# Patient Record
Sex: Female | Born: 1960
Health system: Southern US, Community
[De-identification: ages and names within clinical notes are randomized; demographics above are authoritative.]

## PROBLEM LIST (undated history)

## (undated) DIAGNOSIS — R519 Headache, unspecified: Secondary | ICD-10-CM

## (undated) DIAGNOSIS — R51 Headache: Secondary | ICD-10-CM

## (undated) DIAGNOSIS — F419 Anxiety disorder, unspecified: Secondary | ICD-10-CM

## (undated) HISTORY — DX: Anxiety disorder, unspecified: F41.9

## (undated) HISTORY — DX: Headache, unspecified: R51.9

## (undated) HISTORY — DX: Headache: R51

---

## 1998-01-16 ENCOUNTER — Other Ambulatory Visit: Admission: RE | Admit: 1998-01-16 | Discharge: 1998-01-16 | Payer: Self-pay | Admitting: Gynecology

## 1999-01-28 ENCOUNTER — Other Ambulatory Visit: Admission: RE | Admit: 1999-01-28 | Discharge: 1999-01-28 | Payer: Self-pay | Admitting: Gynecology

## 2000-04-07 ENCOUNTER — Other Ambulatory Visit: Admission: RE | Admit: 2000-04-07 | Discharge: 2000-04-07 | Payer: Self-pay | Admitting: Gynecology

## 2001-05-05 ENCOUNTER — Other Ambulatory Visit: Admission: RE | Admit: 2001-05-05 | Discharge: 2001-05-05 | Payer: Self-pay | Admitting: Gynecology

## 2002-05-29 ENCOUNTER — Other Ambulatory Visit: Admission: RE | Admit: 2002-05-29 | Discharge: 2002-05-29 | Payer: Self-pay | Admitting: Gynecology

## 2003-05-30 ENCOUNTER — Other Ambulatory Visit: Admission: RE | Admit: 2003-05-30 | Discharge: 2003-05-30 | Payer: Self-pay | Admitting: Gynecology

## 2004-06-02 ENCOUNTER — Other Ambulatory Visit: Admission: RE | Admit: 2004-06-02 | Discharge: 2004-06-02 | Payer: Self-pay | Admitting: Gynecology

## 2004-06-17 ENCOUNTER — Encounter: Admission: RE | Admit: 2004-06-17 | Discharge: 2004-06-17 | Payer: Self-pay | Admitting: Gynecology

## 2005-06-10 ENCOUNTER — Other Ambulatory Visit: Admission: RE | Admit: 2005-06-10 | Discharge: 2005-06-10 | Payer: Self-pay | Admitting: Gynecology

## 2006-06-22 ENCOUNTER — Other Ambulatory Visit: Admission: RE | Admit: 2006-06-22 | Discharge: 2006-06-22 | Payer: Self-pay | Admitting: Gynecology

## 2007-07-14 ENCOUNTER — Other Ambulatory Visit: Admission: RE | Admit: 2007-07-14 | Discharge: 2007-07-14 | Payer: Self-pay | Admitting: Gynecology

## 2007-08-25 HISTORY — PX: ABLATION: SHX5711

## 2007-08-25 HISTORY — PX: MYOMECTOMY: SHX85

## 2009-03-08 ENCOUNTER — Encounter (INDEPENDENT_AMBULATORY_CARE_PROVIDER_SITE_OTHER): Payer: Self-pay | Admitting: Gynecology

## 2009-03-08 ENCOUNTER — Ambulatory Visit (HOSPITAL_BASED_OUTPATIENT_CLINIC_OR_DEPARTMENT_OTHER): Admission: RE | Admit: 2009-03-08 | Discharge: 2009-03-08 | Payer: Self-pay | Admitting: Gynecology

## 2010-09-04 ENCOUNTER — Encounter
Admission: RE | Admit: 2010-09-04 | Discharge: 2010-09-04 | Payer: Self-pay | Source: Home / Self Care | Attending: Gynecology | Admitting: Gynecology

## 2010-09-14 ENCOUNTER — Encounter: Payer: Self-pay | Admitting: Gynecology

## 2010-11-30 LAB — POCT HEMOGLOBIN-HEMACUE: Hemoglobin: 12 g/dL (ref 12.0–15.0)

## 2011-01-06 NOTE — Op Note (Signed)
NAMEHOWARD, PATTON                ACCOUNT NO.:  [id number]   MEDICAL RECORD NO.:  [id number]          PATIENT TYPE:  AMB   LOCATION:  [facility]                         FACILITY:  [facility]   PHYSICIAN:  Gretta Cool, M.D. DATE OF BIRTH:  Mar 21, 1961   DATE OF PROCEDURE:  03/08/2009  DATE OF DISCHARGE:                               OPERATIVE REPORT   PREOPERATIVE DIAGNOSES:  Polyp versus fibroid with severe abnormal  uterine bleeding, uncontrolled by conservative measures.   POSTOPERATIVE DIAGNOSES:  Submucous fibroid approximately 2 x 3 cm  filling the entire endometrial cavity.   PROCEDURE:  Hysteroscopy resection of large submucous fibroid and total  endometrial resection for ablation plus VaporTrode.   SURGEON:  Gretta Cool, M.D.   ANESTHESIA:  IV sedation and paracervical block.   DESCRIPTION OF PROCEDURE:  Under excellent IV sedation, the cervix was  grasped with a single tooth tenaculum and paracervical block applied.  The cervix was then progressively dilated with a series of Pratt  dilators to a 33 to accommodate the 7 mm resectoscope.  The rectoscope  was then introduced and the cavity photographed.  There was a large  submucous fibroid attached to the apex of the fundal wall.  It was  progressively resected and removed with the 90 degree resectoscope loop.  Once the entire fibroid was removed, the base of the fibroid was  resected.  A significant plexus of adnexal vessels was encountered.  The  entire endometrial cavity was then also resection so as to remove the  endometrium down 5 mm or so into the myometrium.  At this point, the  entire cavity was treated by cautery with VaporTrode.  The bleeding was  minimal at reduced pressure.  As the hysteroscope was removed, however,  vigorous bleeding ensued.  That was controlled completely with uterine  compression and massage.  The bleeding absolutely ceased.  At this  point, observation continued for 10 minutes and then the  patient was  returned to the recovery room in excellent condition without  complication.  Toradol was withheld because of the gush of bleeding and  bleeding from the sinuses at the resection site of the submucous  fibroid.           ______________________________  Gretta Cool, M.D.     CWL/MEDQ  D:  03/08/2009  T:  03/08/2009  Job:  [id number]

## 2011-09-07 ENCOUNTER — Other Ambulatory Visit: Payer: Self-pay | Admitting: Gynecology

## 2012-06-29 ENCOUNTER — Other Ambulatory Visit: Payer: Self-pay | Admitting: Gynecology

## 2012-06-29 DIAGNOSIS — Z1231 Encounter for screening mammogram for malignant neoplasm of breast: Secondary | ICD-10-CM

## 2012-07-26 ENCOUNTER — Ambulatory Visit: Payer: Self-pay

## 2012-08-03 ENCOUNTER — Ambulatory Visit: Payer: Self-pay

## 2012-09-19 ENCOUNTER — Other Ambulatory Visit: Payer: Self-pay | Admitting: Gynecology

## 2012-10-03 ENCOUNTER — Other Ambulatory Visit: Payer: Self-pay | Admitting: Gynecology

## 2012-10-03 DIAGNOSIS — Z780 Asymptomatic menopausal state: Secondary | ICD-10-CM

## 2012-10-03 DIAGNOSIS — Z1231 Encounter for screening mammogram for malignant neoplasm of breast: Secondary | ICD-10-CM

## 2012-10-28 ENCOUNTER — Inpatient Hospital Stay: Admission: RE | Admit: 2012-10-28 | Payer: Self-pay | Source: Ambulatory Visit

## 2012-10-28 ENCOUNTER — Ambulatory Visit: Payer: Self-pay

## 2014-07-26 ENCOUNTER — Ambulatory Visit (INDEPENDENT_AMBULATORY_CARE_PROVIDER_SITE_OTHER): Payer: BC Managed Care – PPO | Admitting: Podiatry

## 2014-07-26 ENCOUNTER — Ambulatory Visit (INDEPENDENT_AMBULATORY_CARE_PROVIDER_SITE_OTHER): Payer: BC Managed Care – PPO

## 2014-07-26 ENCOUNTER — Other Ambulatory Visit: Payer: Self-pay | Admitting: *Deleted

## 2014-07-26 ENCOUNTER — Encounter: Payer: Self-pay | Admitting: Podiatry

## 2014-07-26 VITALS — BP 123/75 | HR 74 | Resp 16 | Ht 67.0 in | Wt 175.0 lb

## 2014-07-26 DIAGNOSIS — M778 Other enthesopathies, not elsewhere classified: Secondary | ICD-10-CM

## 2014-07-26 DIAGNOSIS — M7751 Other enthesopathy of right foot: Secondary | ICD-10-CM

## 2014-07-26 DIAGNOSIS — M779 Enthesopathy, unspecified: Secondary | ICD-10-CM

## 2014-07-26 DIAGNOSIS — M2041 Other hammer toe(s) (acquired), right foot: Secondary | ICD-10-CM

## 2014-07-26 NOTE — Progress Notes (Signed)
   Subjective:    Patient ID: Sharon Kennedy, female    DOB: 22-Jul-1961, 53 y.o.   MRN: [id number]  HPI Comments: Corn on the inside of the pinky toe that is painful. Right 5th toe corn on the inside and outside of toe  Toe Pain       Review of Systems  All other systems reviewed and are negative.      Objective:   Physical Exam: I have reviewed her past medical history medications allergy surgery social history and review of systems. Pulses are strongly palpable bilateral. Neurologic sensorium is intact her Semmes-Weinstein monofilament. The tendon reflexes are intact bilateral muscle strength is 5 over 5 dorsiflexion plantar flexors and inverters and everters all digits musculature is intact. Adductor varus rotated hammertoe deformity is resulting in soft tissue mass between the fourth and fifth toes. She also has pain about the PIPJ of the fifth digit of the right foot possibly associated with osteoarthritic changes which is demonstrated on radiographs.        Assessment & Plan:  Assessment: Soft tissue corn between the fourth and fifth digits of the right foot associated with hammertoe deformity and capsulitis.  Plan: I injected the area today with dexamethasone and local anesthetic and debrided the reactive hyperkeratosis sharply. We also supplied padding and discussed surgical intervention I will follow up with her in the near future for surgical intervention which will include a derotational arthroplasty of fifth digit right foot.

## 2015-07-17 ENCOUNTER — Ambulatory Visit: Payer: BLUE CROSS/BLUE SHIELD | Attending: Gynecologic Oncology | Admitting: Gynecologic Oncology

## 2015-07-17 ENCOUNTER — Encounter: Payer: Self-pay | Admitting: Gynecologic Oncology

## 2015-07-17 VITALS — BP 135/60 | HR 65 | Temp 97.3°F | Resp 18 | Ht 67.0 in | Wt 178.9 lb

## 2015-07-17 DIAGNOSIS — N9489 Other specified conditions associated with female genital organs and menstrual cycle: Secondary | ICD-10-CM | POA: Diagnosis not present

## 2015-07-17 DIAGNOSIS — N858 Other specified noninflammatory disorders of uterus: Secondary | ICD-10-CM

## 2015-07-17 NOTE — Progress Notes (Signed)
Consult Note: Gyn-Onc  Consult was requested by Dr. Senaida Ores for the evaluation of Sharon Kennedy 54 y.o. female with a uterine mass  CC:  Chief Complaint  Patient presents with  . Uterine Mass    New patient    Assessment/Plan:  Ms. Sharon Kennedy  is a 54 y.o.  year old with a 4x2.5cm posterior uterine wall mass that is irregular and concerning on imaging. It is asymptomatic.  I performed a history, physical examination, and personally reviewed the patient's imaging films including her ultrasound images from 107/16.  I discussed with Sharon Kennedy that the differential for this mass could be degenerating leiomyoma, scar with retained blood, or potential sarcoma (less likely endometrial ca).  I discussed that no imaging modality (including PET or MRI) can definitively evaluate or diagnose malignancy as they only give structural/anatomic information. PET would potentially show increased avidity, though this is non-specific, and could be present in the setting of benign degeneration of a fibroid. The most defnitive means of diagnosis would be hysterectomy. I believe she is a good surgical candidate for a minimally invasive hysterectomy, though morcellation should be avoided. Despite having low risks for surgery, risk is still inherent with hysterectomy. We discussed these. A less definitive but also reasonable option for determining whether or not this is malignant, would be to observe the natural history of the lesion with serial ultrasounds. If the lesion remains stable in size, we can feel confident that it is not malignant. If it grows in size on serial imaging, while this is not diagnostic for malignancy, it is not reassuring, and hysterectomy would certainly be recommended.  After hearing her options, the patient would prefer to avoid surgery until we have more information in time regarding the natural history of this lesion.  She is opting for repeat US in 3 months with Dr Senaida Ores. If it  is stable at that imaging, I would recommend a repeat US in another 3 months. If it increases in size in the 3 month period, she should proceed with hysterectomy.  It is reasonable to consider MRI imaging of the mass with serum LDH.  While this would not be defnitive, it will provide additional information about the characterization of the mass.   HPI: Sharon Kennedy is a 54 year old gravida 3 para 3 who is seen in consultation at the request of Dr. Huel Cote for a uterine mass. The patient reports having pelvic pain in September 2016. She was seen by Dr. Senaida Ores in October 2016 and on 05/31/2015 she underwent a transvaginal ultrasound scan to evaluate the pain. The patient states that the pain has subsequently resolved.  The ultrasound on 05/31/2015 revealed an anteverted uterus that measured 8.9 x 5.7 x 3.9 cm and contained an echogenic complex mass in the posterior fundal myometrium with vascular areas seen. There is appeared slightly irregular. The mass measured 42x25 millimeters. There are diffuse cystic areas throughout the mass. The endometrial is faintly seen displaced anteriorly. Postoperative the mass could represent a degenerating fibroid but could not rule out uterine sarcoma based on its appearance.  The patient denies vaginal bleeding since her endometrial ablation and hysteroscopic resection of a fibroid in 2010.   She is otherwise healthy and says that her only prior surgery is a diagnostic laparoscopy performed more than a decade ago. She's had 3 vaginal deliveries in the past. She denies symptoms of mass effect or bulk.  Current Meds:  No outpatient encounter prescriptions on file as of 07/17/2015.   No  facility-administered encounter medications on file as of 07/17/2015.    Allergy:  Allergies  Allergen Reactions  . Sulfa Antibiotics Shortness Of Breath    Social Hx:   Social History   Social History  . Marital Status: Divorced    Spouse Name: N/A  . Number  of Children: N/A  . Years of Education: N/A   Occupational History  . Not on file.   Social History Main Topics  . Smoking status: Never Smoker   . Smokeless tobacco: Not on file  . Alcohol Use: 0.0 oz/week    0 Standard drinks or equivalent per week     Comment: 2 x a week  . Drug Use: Not on file  . Sexual Activity: Not on file   Other Topics Concern  . Not on file   Social History Narrative    Past Surgical Hx:  Past Surgical History  Procedure Laterality Date  . Ablation    . Myomectomy  2009    Past Medical Hx: History reviewed. No pertinent past medical history.  Past Gynecological History:  SVD x 3. Fibroids (s/p laparoscopic myoma resection and ablation in 2010)   No LMP recorded. Patient is postmenopausal.  Family Hx: History reviewed. No pertinent family history.  Review of Systems:  Constitutional  Feels well,   ENT Normal appearing ears and nares bilaterally Skin/Breast  No rash, sores, jaundice, itching, dryness Cardiovascular  No chest pain, shortness of breath, or edema  Pulmonary  No cough or wheeze.  Gastro Intestinal  No nausea, vomitting, or diarrhoea. No bright red blood per rectum, no abdominal pain, change in bowel movement, or constipation.  Genito Urinary  No frequency, urgency, dysuria, no bleeding Musculo Skeletal  No myalgia, arthralgia, joint swelling or pain  Neurologic  No weakness, numbness, change in gait,  Psychology  No depression, anxiety, insomnia.   Vitals:  Blood pressure 135/60, pulse 65, temperature 97.3 F (36.3 C), temperature source Oral, resp. rate 18, height 5\' 7"  (1.702 m), weight 178 lb 14.4 oz (81.149 kg), SpO2 100 %.  Physical Exam: WD in NAD Neck  Supple NROM, without any enlargements.  Lymph Node Survey No cervical supraclavicular or inguinal adenopathy Cardiovascular  Pulse normal rate, regularity and rhythm. S1 and S2 normal.  Lungs  Clear to auscultation bilateraly, without  wheezes/crackles/rhonchi. Good air movement.  Skin  No rash/lesions/breakdown  Psychiatry  Alert and oriented to person, place, and time  Abdomen  Normoactive bowel sounds, abdomen soft, non-tender and thin without evidence of hernia.  Back No CVA tenderness Genito Urinary  Vulva/vagina: Normal external female genitalia.  No lesions. No discharge or bleeding.  Bladder/urethra:  No lesions or masses, well supported bladder  Vagina: normal  Cervix: Normal appearing, no lesions.  Uterus:  Slightly bulky, mobile, no parametrial involvement or nodularity.  Adnexa: no palpable masses. Rectal  Good tone, no masses no cul de sac nodularity.  Extremities  No bilateral cyanosis, clubbing or edema.   Quinn Axeossi, Chemika Nightengale Caroline, MD  07/17/2015, 11:21 AM

## 2015-07-17 NOTE — Patient Instructions (Signed)
Plan to follow up with Dr. Senaida Oresichardson.  Please call for any questions or concerns.

## 2016-02-04 ENCOUNTER — Emergency Department (HOSPITAL_COMMUNITY): Payer: 59

## 2016-02-04 ENCOUNTER — Emergency Department (HOSPITAL_COMMUNITY)
Admission: EM | Admit: 2016-02-04 | Discharge: 2016-02-04 | Disposition: A | Discharge status: Home / Self Care | Payer: 59 | Attending: Emergency Medicine | Admitting: Emergency Medicine

## 2016-02-04 ENCOUNTER — Encounter (HOSPITAL_COMMUNITY): Payer: Self-pay | Admitting: Emergency Medicine

## 2016-02-04 DIAGNOSIS — Z79899 Other long term (current) drug therapy: Secondary | ICD-10-CM | POA: Insufficient documentation

## 2016-02-04 DIAGNOSIS — T148 Other injury of unspecified body region: Secondary | ICD-10-CM | POA: Insufficient documentation

## 2016-02-04 DIAGNOSIS — W0110XA Fall on same level from slipping, tripping and stumbling with subsequent striking against unspecified object, initial encounter: Secondary | ICD-10-CM | POA: Diagnosis not present

## 2016-02-04 DIAGNOSIS — Y9389 Activity, other specified: Secondary | ICD-10-CM | POA: Insufficient documentation

## 2016-02-04 DIAGNOSIS — Y999 Unspecified external cause status: Secondary | ICD-10-CM | POA: Diagnosis not present

## 2016-02-04 DIAGNOSIS — S0990XA Unspecified injury of head, initial encounter: Secondary | ICD-10-CM | POA: Diagnosis present

## 2016-02-04 DIAGNOSIS — T148XXA Other injury of unspecified body region, initial encounter: Secondary | ICD-10-CM

## 2016-02-04 DIAGNOSIS — Y929 Unspecified place or not applicable: Secondary | ICD-10-CM | POA: Diagnosis not present

## 2016-02-04 DIAGNOSIS — W19XXXA Unspecified fall, initial encounter: Secondary | ICD-10-CM

## 2016-02-04 MED ORDER — NAPROXEN 500 MG PO TABS
500.0000 mg | ORAL_TABLET | Freq: Once | ORAL | Status: AC
  Administered 2016-02-04: 500 mg via ORAL
  Filled 2016-02-04: qty 1

## 2016-02-04 MED ORDER — TETRACAINE HCL 0.5 % OP SOLN
2.0000 [drp] | Freq: Once | OPHTHALMIC | Status: AC
  Administered 2016-02-04: 2 [drp] via OPHTHALMIC
  Filled 2016-02-04: qty 4

## 2016-02-04 MED ORDER — FLUORESCEIN SODIUM 1 MG OP STRP
1.0000 | ORAL_STRIP | Freq: Once | OPHTHALMIC | Status: AC
  Administered 2016-02-04: 1 via OPHTHALMIC
  Filled 2016-02-04: qty 1

## 2016-02-04 MED ORDER — DICLOFENAC SODIUM ER 100 MG PO TB24: 100.0000 mg | ORAL_TABLET | Freq: Every day | ORAL | Status: AC

## 2016-02-04 MED ORDER — METHOCARBAMOL 500 MG PO TABS: 500.0000 mg | ORAL_TABLET | Freq: Two times a day (BID) | ORAL | Status: DC

## 2016-02-04 MED ORDER — METHOCARBAMOL 500 MG PO TABS
1000.0000 mg | ORAL_TABLET | Freq: Once | ORAL | Status: AC
  Administered 2016-02-04: 1000 mg via ORAL
  Filled 2016-02-04: qty 2

## 2016-02-04 MED ORDER — OXYCODONE-ACETAMINOPHEN 5-325 MG PO TABS
1.0000 | ORAL_TABLET | ORAL | Status: DC | PRN
Start: 2016-02-04 — End: 2016-02-04
  Administered 2016-02-04: 1 via ORAL
  Filled 2016-02-04: qty 1

## 2016-02-04 NOTE — ED Notes (Signed)
Pain medication given in Triage. Patient advised about side effects of medications and  to avoid driving for a minimum of 4 hours.  Pt was ambulatory to bathroom but family believes that she is too weak to go to the waiting room. Triage nurse provided pt with pain medication, ginger ale, and an ice pack. Pt was left in triage room and informed that the MD would not be able to see her up here but we would get her back to a room as soon as possible.

## 2016-02-04 NOTE — ED Provider Notes (Addendum)
CSN: [id number]     Arrival date & time 02/04/16  0100 History  By signing my name below, I, Jasmyn B. Alexander, attest that this documentation has been prepared under the direction and in the presence of Geroldine Esquivias, MD.  Electronically Signed: Gillis Ends. Lyn Hollingshead, ED Scribe. 02/04/2016. 3:39 AM.    Chief Complaint  Patient presents with  . Fall   Patient is a 55 y.o. female presenting with fall. The history is provided by the patient. No language interpreter was used.  Fall This is a new problem. The current episode started 3 to 5 hours ago. The problem occurs rarely. The problem has not changed since onset.Pertinent negatives include no chest pain, no abdominal pain, no headaches and no shortness of breath. Nothing aggravates the symptoms. The symptoms are relieved by medications and ice. She has tried a cold compress for the symptoms. The treatment provided mild relief.   HPI Comments: Sharon Kennedy is a 55 y.o. female who presents to the Emergency Department complaining of gradual onset, constant bilateral shoulder pain, face pain, and right arm pain s/p fall that occurred PTA. Pt states she fell in the evening of 02/03/16 while eating dinner. No LOC. Per triage note, pt received oxycodone and ice pack for pain and facial swelling with mild relief. There are no other associated symptoms.  History reviewed. No pertinent past medical history. Past Surgical History  Procedure Laterality Date  . Ablation    . Myomectomy  2009   History reviewed. No pertinent family history. Social History  Substance Use Topics  . Smoking status: Never Smoker   . Smokeless tobacco: None  . Alcohol Use: 0.0 oz/week    0 Standard drinks or equivalent per week     Comment: 2 x a week   OB History    No data available     Review of Systems  Constitutional: Negative for fever.  Eyes: Negative for photophobia, pain, redness and visual disturbance.  Respiratory: Negative for shortness of breath.    Cardiovascular: Negative for chest pain, palpitations and leg swelling.  Gastrointestinal: Negative for nausea, vomiting and abdominal pain.  Musculoskeletal: Positive for arthralgias. Negative for neck stiffness.  Neurological: Negative for dizziness, facial asymmetry, speech difficulty, weakness, light-headedness, numbness and headaches.  All other systems reviewed and are negative.   Allergies  Sulfa antibiotics  Home Medications   Prior to Admission medications   Not on File   BP 158/80 mmHg  Pulse 78  Temp(Src) 97.9 F (36.6 C) (Oral)  Resp 18  SpO2 97% Physical Exam  Constitutional: She is oriented to person, place, and time. She appears well-developed and well-nourished. No distress.  HENT:  Head: Normocephalic and atraumatic. Head is without raccoon's eyes and without Battle's sign.  Mouth/Throat: Oropharynx is clear and moist. No oropharyngeal exudate.  Moist mucous membranes   Eyes: Conjunctivae and EOM are normal. Pupils are equal, round, and reactive to light. Right eye exhibits no discharge. Left eye exhibits no discharge. Right conjunctiva is not injected. Left conjunctiva is not injected. No scleral icterus.  Slit lamp exam:      The right eye shows no corneal abrasion, no corneal flare, no corneal ulcer, no hyphema and no fluorescein uptake.  Neck: Normal range of motion. Neck supple. No JVD present.  Trachea midline No bruit  Cardiovascular: Normal rate, regular rhythm, normal heart sounds and intact distal pulses.   Cap refill 2 seconds  Pulmonary/Chest: Effort normal and breath sounds normal. No stridor. No  respiratory distress. She has no wheezes. She has no rales. She exhibits no tenderness.  Abdominal: Soft. Bowel sounds are normal. She exhibits no distension. There is no tenderness. There is no rebound and no guarding.  Musculoskeletal: Normal range of motion. She exhibits no edema or tenderness.       Right shoulder: Normal.       Right elbow:  Normal.      Right wrist: Normal.       Right hip: Normal.       Left hip: Normal.       Right ankle: Normal. Achilles tendon normal.       Left ankle: Normal. Achilles tendon normal.       Cervical back: Normal.       Right forearm: Normal.  Pelvis stable. No Snuff box. Biceps, triceps intact  Neurological: She is alert and oriented to person, place, and time. She has normal reflexes.  Skin: Skin is warm and dry.  Psychiatric: She has a normal mood and affect. Her behavior is normal.  Nursing note and vitals reviewed.   ED Course  Procedures (including critical care time) DIAGNOSTIC STUDIES: Oxygen Saturation is 97% on RA, normal by my interpretation.    COORDINATION OF CARE: 3:38 AM-Discussed treatment plan which includes X-ray of right shoulder with pt at bedside and pt agreed to plan. Will order Robaxin and Naproxen.  Labs Review Labs Reviewed - No data to display  Imaging Review No results found. I have personally reviewed and evaluated these images and lab results as part of my medical decision-making.   MDM   Final diagnoses:  None   Filed Vitals:   02/04/16 0109  BP: 158/80  Pulse: 78  Temp: 97.9 F (36.6 C)  Resp: 18   Results for orders placed or performed during the hospital encounter of 03/08/09  Hemoglobin-hemacue, POC  Result Value Ref Range   Hemoglobin 12.0 12.0 - 15.0 g/dL   Dg Shoulder Right  1/61/0960  CLINICAL DATA:  Anterior right shoulder pain after a fall. EXAM: RIGHT SHOULDER - 2+ VIEW COMPARISON:  None. FINDINGS: There is no evidence of fracture or dislocation. There is no evidence of arthropathy or other focal bone abnormality. Soft tissues are unremarkable. IMPRESSION: Negative. Electronically Signed   By: Burman Nieves M.D.   On: 02/04/2016 04:21   Ct Head Wo Contrast  02/04/2016  CLINICAL DATA:  Slip and fall on greasy floor at restaurant, landing face first. Facial and RIGHT arm pain. EXAM: CT HEAD WITHOUT CONTRAST CT  MAXILLOFACIAL WITHOUT CONTRAST CT CERVICAL SPINE WITHOUT CONTRAST TECHNIQUE: Multidetector CT imaging of the head, cervical spine, and maxillofacial structures were performed using the standard protocol without intravenous contrast. Multiplanar CT image reconstructions of the cervical spine and maxillofacial structures were also generated. COMPARISON:  None. FINDINGS: CT HEAD FINDINGS INTRACRANIAL CONTENTS: The ventricles and sulci are normal. No intraparenchymal hemorrhage, mass effect nor midline shift. No acute large vascular territory infarcts. No abnormal extra-axial fluid collections. Basal cisterns are patent. SKULL/SOFT TISSUES: No skull fracture. No significant soft tissue swelling. CT MAXILLOFACIAL FINDINGS FACIAL BONES: The mandible is intact, the condyles are located. No acute facial fracture. No destructive bony lesions. SINUSES: Small LEFT maxillary sinus mucosal retention cyst without paranasal sinus air-fluid levels. The mastoid air cells are well aerated. ORBITS: Ocular globes and orbital contents are unremarkable. SOFT TISSUES: RIGHT facial and periorbital soft tissue swelling without subcutaneous gas radiopaque foreign bodies. CT CERVICAL SPINE FINDINGS OSSEOUS STRUCTURES: Cervical vertebral bodies and  posterior elements are intact and aligned with maintenance of the cervical lordosis. Mild C3-4, moderate C4-5, and C5-6 disc height loss with uncovertebral hypertrophy. Severe LEFT C4-5 neural foraminal narrowing. No destructive bony lesions. C1-2 articulation maintained. Moderate RIGHT C7-T1 facet arthropathy. SOFT TISSUES: Included prevertebral and paraspinal soft tissues are unremarkable. IMPRESSION: CT HEAD: Negative CT HEAD. CT MAXILLOFACIAL: RIGHT facial and periorbital soft tissue swelling without postseptal hematoma. No acute facial fracture. CT CERVICAL SPINE: Straightened cervical lordosis without acute fracture or malalignment. Severe LEFT C4-5 neural foraminal narrowing. Electronically  Signed   By: Awilda Metro M.D.   On: 02/04/2016 04:08   Ct Cervical Spine Wo Contrast  02/04/2016  CLINICAL DATA:  Slip and fall on greasy floor at restaurant, landing face first. Facial and RIGHT arm pain. EXAM: CT HEAD WITHOUT CONTRAST CT MAXILLOFACIAL WITHOUT CONTRAST CT CERVICAL SPINE WITHOUT CONTRAST TECHNIQUE: Multidetector CT imaging of the head, cervical spine, and maxillofacial structures were performed using the standard protocol without intravenous contrast. Multiplanar CT image reconstructions of the cervical spine and maxillofacial structures were also generated. COMPARISON:  None. FINDINGS: CT HEAD FINDINGS INTRACRANIAL CONTENTS: The ventricles and sulci are normal. No intraparenchymal hemorrhage, mass effect nor midline shift. No acute large vascular territory infarcts. No abnormal extra-axial fluid collections. Basal cisterns are patent. SKULL/SOFT TISSUES: No skull fracture. No significant soft tissue swelling. CT MAXILLOFACIAL FINDINGS FACIAL BONES: The mandible is intact, the condyles are located. No acute facial fracture. No destructive bony lesions. SINUSES: Small LEFT maxillary sinus mucosal retention cyst without paranasal sinus air-fluid levels. The mastoid air cells are well aerated. ORBITS: Ocular globes and orbital contents are unremarkable. SOFT TISSUES: RIGHT facial and periorbital soft tissue swelling without subcutaneous gas radiopaque foreign bodies. CT CERVICAL SPINE FINDINGS OSSEOUS STRUCTURES: Cervical vertebral bodies and posterior elements are intact and aligned with maintenance of the cervical lordosis. Mild C3-4, moderate C4-5, and C5-6 disc height loss with uncovertebral hypertrophy. Severe LEFT C4-5 neural foraminal narrowing. No destructive bony lesions. C1-2 articulation maintained. Moderate RIGHT C7-T1 facet arthropathy. SOFT TISSUES: Included prevertebral and paraspinal soft tissues are unremarkable. IMPRESSION: CT HEAD: Negative CT HEAD. CT MAXILLOFACIAL: RIGHT  facial and periorbital soft tissue swelling without postseptal hematoma. No acute facial fracture. CT CERVICAL SPINE: Straightened cervical lordosis without acute fracture or malalignment. Severe LEFT C4-5 neural foraminal narrowing. Electronically Signed   By: Awilda Metro M.D.   On: 02/04/2016 04:08   Ct Maxillofacial Wo Cm  02/04/2016  CLINICAL DATA:  Slip and fall on greasy floor at restaurant, landing face first. Facial and RIGHT arm pain. EXAM: CT HEAD WITHOUT CONTRAST CT MAXILLOFACIAL WITHOUT CONTRAST CT CERVICAL SPINE WITHOUT CONTRAST TECHNIQUE: Multidetector CT imaging of the head, cervical spine, and maxillofacial structures were performed using the standard protocol without intravenous contrast. Multiplanar CT image reconstructions of the cervical spine and maxillofacial structures were also generated. COMPARISON:  None. FINDINGS: CT HEAD FINDINGS INTRACRANIAL CONTENTS: The ventricles and sulci are normal. No intraparenchymal hemorrhage, mass effect nor midline shift. No acute large vascular territory infarcts. No abnormal extra-axial fluid collections. Basal cisterns are patent. SKULL/SOFT TISSUES: No skull fracture. No significant soft tissue swelling. CT MAXILLOFACIAL FINDINGS FACIAL BONES: The mandible is intact, the condyles are located. No acute facial fracture. No destructive bony lesions. SINUSES: Small LEFT maxillary sinus mucosal retention cyst without paranasal sinus air-fluid levels. The mastoid air cells are well aerated. ORBITS: Ocular globes and orbital contents are unremarkable. SOFT TISSUES: RIGHT facial and periorbital soft tissue swelling without subcutaneous gas radiopaque foreign  bodies. CT CERVICAL SPINE FINDINGS OSSEOUS STRUCTURES: Cervical vertebral bodies and posterior elements are intact and aligned with maintenance of the cervical lordosis. Mild C3-4, moderate C4-5, and C5-6 disc height loss with uncovertebral hypertrophy. Severe LEFT C4-5 neural foraminal narrowing. No  destructive bony lesions. C1-2 articulation maintained. Moderate RIGHT C7-T1 facet arthropathy. SOFT TISSUES: Included prevertebral and paraspinal soft tissues are unremarkable. IMPRESSION: CT HEAD: Negative CT HEAD. CT MAXILLOFACIAL: RIGHT facial and periorbital soft tissue swelling without postseptal hematoma. No acute facial fracture. CT CERVICAL SPINE: Straightened cervical lordosis without acute fracture or malalignment. Severe LEFT C4-5 neural foraminal narrowing. Electronically Signed   By: Awilda Metroourtnay  Bloomer M.D.   On: 02/04/2016 04:08      Visual Acuity  Right Eye Distance: 20/70 Left Eye Distance: 20/40 Bilateral Distance: 20/50  Right Eye Near:   Left Eye Near:    Bilateral Near:      Medications  oxyCODONE-acetaminophen (PERCOCET/ROXICET) 5-325 MG per tablet 1 tablet (1 tablet Oral Given 02/04/16 0129)  tetracaine (PONTOCAINE) 0.5 % ophthalmic solution 2 drop (not administered)  fluorescein ophthalmic strip 1 strip (not administered)  methocarbamol (ROBAXIN) tablet 1,000 mg (1,000 mg Oral Given 02/04/16 0433)  naproxen (NAPROSYN) tablet 500 mg (500 mg Oral Given 02/04/16 0433)   Patient reported vision felt a bit different in the right eye since fall.   Examined for abrasions. No signs of trauma.  Will have patient follow up with ophthalmology  Within 48 hours for recheck as well as with her PMD.      I personally performed the services described in this documentation, which was scribed in my presence. The recorded information has been reviewed and is accurate.     Cy BlamerApril Xavion Muscat, MD 02/04/16 [id number]  Cy BlamerApril Brealynn Contino, MD 02/04/16 [phone]

## 2016-02-04 NOTE — Discharge Instructions (Signed)

## 2016-02-04 NOTE — ED Notes (Signed)
Pt states that she fell tonight while eating dinner, hitting her face. Swelling around R eye noted. Pain in shoulders. Alert and oriented. Denies LOC.

## 2016-02-17 ENCOUNTER — Ambulatory Visit (INDEPENDENT_AMBULATORY_CARE_PROVIDER_SITE_OTHER): Payer: 59 | Admitting: Neurology

## 2016-02-17 ENCOUNTER — Encounter: Payer: Self-pay | Admitting: Neurology

## 2016-02-17 VITALS — BP 113/70 | HR 73 | Ht 67.0 in | Wt 182.8 lb

## 2016-02-17 DIAGNOSIS — F0781 Postconcussional syndrome: Secondary | ICD-10-CM

## 2016-02-17 DIAGNOSIS — R519 Headache, unspecified: Secondary | ICD-10-CM

## 2016-02-17 DIAGNOSIS — R42 Dizziness and giddiness: Secondary | ICD-10-CM

## 2016-02-17 DIAGNOSIS — R51 Headache: Secondary | ICD-10-CM | POA: Diagnosis not present

## 2016-02-17 MED ORDER — AMITRIPTYLINE HCL 25 MG PO TABS: 25.0000 mg | ORAL_TABLET | Freq: Every day | ORAL | Status: AC

## 2016-02-17 NOTE — Patient Instructions (Addendum)
Overall you are doing fairly well but I do want to suggest a few things today:   Remember to drink plenty of fluid, eat healthy meals and do not skip any meals. Try to eat protein with a every meal and eat a healthy snack such as fruit or nuts in between meals. Try to keep a regular sleep-wake schedule and try to exercise daily, particularly in the form of walking, 20-30 minutes a day, if you can.   As far as your medications are concerned, I would like to suggest; Amitriptyline  As far as diagnostic testing: Labs today  Discussed with patient at length. Rest is important in concussion recovery. Recommend shortened work days, working from home if she can, taking frequent breaks, reduced workload. No strenuous activity, limiting computer and reading time.   Our phone number is [phone]. We also have an after hours call service for urgent matters and there is a physician on-call for urgent questions. For any emergencies you know to call 911 or go to the nearest emergency room  Concussion, Adult A concussion, or closed-head injury, is a brain injury caused by a direct blow to the head or by a quick and sudden movement (jolt) of the head or neck. Concussions are usually not life-threatening. Even so, the effects of a concussion can be serious. If you have had a concussion before, you are more likely to experience concussion-like symptoms after a direct blow to the head.  CAUSES  Direct blow to the head, such as from running into another player during a soccer game, being hit in a fight, or hitting your head on a hard surface.  A jolt of the head or neck that causes the brain to move back and forth inside the skull, such as in a car crash. SIGNS AND SYMPTOMS The signs of a concussion can be hard to notice. Early on, they may be missed by you, family members, and health care providers. You may look fine but act or feel differently. Symptoms are usually temporary, but they may last for days, weeks,  or even longer. Some symptoms may appear right away while others may not show up for hours or days. Every head injury is different. Symptoms include:  Mild to moderate headaches that will not go away.  A feeling of pressure inside your head.  Having more trouble than usual:  Learning or remembering things you have heard.  Answering questions.  Paying attention or concentrating.  Organizing daily tasks.  Making decisions and solving problems.  Slowness in thinking, acting or reacting, speaking, or reading.  Getting lost or being easily confused.  Feeling tired all the time or lacking energy (fatigued).  Feeling drowsy.  Sleep disturbances.  Sleeping more than usual.  Sleeping less than usual.  Trouble falling asleep.  Trouble sleeping (insomnia).  Loss of balance or feeling lightheaded or dizzy.  Nausea or vomiting.  Numbness or tingling.  Increased sensitivity to:  Sounds.  Lights.  Distractions.  Vision problems or eyes that tire easily.  Diminished sense of taste or smell.  Ringing in the ears.  Mood changes such as feeling sad or anxious.  Becoming easily irritated or angry for little or no reason.  Lack of motivation.  Seeing or hearing things other people do not see or hear (hallucinations). DIAGNOSIS Your health care provider can usually diagnose a concussion based on a description of your injury and symptoms. He or she will ask whether you passed out (lost consciousness) and whether you are  having trouble remembering events that happened right before and during your injury. Your evaluation might include:  A brain scan to look for signs of injury to the brain. Even if the test shows no injury, you may still have a concussion.  Blood tests to be sure other problems are not present. TREATMENT  Concussions are usually treated in an emergency department, in urgent care, or at a clinic. You may need to stay in the hospital overnight for further  treatment.  Tell your health care provider if you are taking any medicines, including prescription medicines, over-the-counter medicines, and natural remedies. Some medicines, such as blood thinners (anticoagulants) and aspirin, may increase the chance of complications. Also tell your health care provider whether you have had alcohol or are taking illegal drugs. This information may affect treatment.  Your health care provider will send you home with important instructions to follow.  How fast you will recover from a concussion depends on many factors. These factors include how severe your concussion is, what part of your brain was injured, your age, and how healthy you were before the concussion.  Most people with mild injuries recover fully. Recovery can take time. In general, recovery is slower in older persons. Also, persons who have had a concussion in the past or have other medical problems may find that it takes longer to recover from their current injury. HOME CARE INSTRUCTIONS General Instructions  Carefully follow the directions your health care provider gave you.  Only take over-the-counter or prescription medicines for pain, discomfort, or fever as directed by your health care provider.  Take only those medicines that your health care provider has approved.  Do not drink alcohol until your health care provider says you are well enough to do so. Alcohol and certain other drugs may slow your recovery and can put you at risk of further injury.  If it is harder than usual to remember things, write them down.  If you are easily distracted, try to do one thing at a time. For example, do not try to watch TV while fixing dinner.  Talk with family members or close friends when making important decisions.  Keep all follow-up appointments. Repeated evaluation of your symptoms is recommended for your recovery.  Watch your symptoms and tell others to do the same. Complications sometimes  occur after a concussion. Older adults with a brain injury may have a higher risk of serious complications, such as a blood clot on the brain.  Tell your teachers, school nurse, school counselor, coach, athletic trainer, or work Production designer, theatre/television/filmmanager about your injury, symptoms, and restrictions. Tell them about what you can or cannot do. They should watch for:  Increased problems with attention or concentration.  Increased difficulty remembering or learning new information.  Increased time needed to complete tasks or assignments.  Increased irritability or decreased ability to cope with stress.  Increased symptoms.  Rest. Rest helps the brain to heal. Make sure you:  Get plenty of sleep at night. Avoid staying up late at night.  Keep the same bedtime hours on weekends and weekdays.  Rest during the day. Take daytime naps or rest breaks when you feel tired.  Limit activities that require a lot of thought or concentration. These include:  Doing homework or job-related work.  Watching TV.  Working on the computer.  Avoid any situation where there is potential for another head injury (football, hockey, soccer, basketball, martial arts, downhill snow sports and horseback riding). Your condition will get  worse every time you experience a concussion. You should avoid these activities until you are evaluated by the appropriate follow-up health care providers. Returning To Your Regular Activities You will need to return to your normal activities slowly, not all at once. You must give your body and brain enough time for recovery.  Do not return to sports or other athletic activities until your health care provider tells you it is safe to do so.  Ask your health care provider when you can drive, ride a bicycle, or operate heavy machinery. Your ability to react may be slower after a brain injury. Never do these activities if you are dizzy.  Ask your health care provider about when you can return to work  or school. Preventing Another Concussion It is very important to avoid another brain injury, especially before you have recovered. In rare cases, another injury can lead to permanent brain damage, brain swelling, or death. The risk of this is greatest during the first 7-10 days after a head injury. Avoid injuries by:  Wearing a seat belt when riding in a car.  Drinking alcohol only in moderation.  Wearing a helmet when biking, skiing, skateboarding, skating, or doing similar activities.  Avoiding activities that could lead to a second concussion, such as contact or recreational sports, until your health care provider says it is okay.  Taking safety measures in your home.  Remove clutter and tripping hazards from floors and stairways.  Use grab bars in bathrooms and handrails by stairs.  Place non-slip mats on floors and in bathtubs.  Improve lighting in dim areas. SEEK MEDICAL CARE IF:  You have increased problems paying attention or concentrating.  You have increased difficulty remembering or learning new information.  You need more time to complete tasks or assignments than before.  You have increased irritability or decreased ability to cope with stress.  You have more symptoms than before. Seek medical care if you have any of the following symptoms for more than 2 weeks after your injury:  Lasting (chronic) headaches.  Dizziness or balance problems.  Nausea.  Vision problems.  Increased sensitivity to noise or light.  Depression or mood swings.  Anxiety or irritability.  Memory problems.  Difficulty concentrating or paying attention.  Sleep problems.  Feeling tired all the time. SEEK IMMEDIATE MEDICAL CARE IF:  You have severe or worsening headaches. These may be a sign of a blood clot in the brain.  You have weakness (even if only in one hand, leg, or part of the face).  You have numbness.  You have decreased coordination.  You vomit  repeatedly.  You have increased sleepiness.  One pupil is larger than the other.  You have convulsions.  You have slurred speech.  You have increased confusion. This may be a sign of a blood clot in the brain.  You have increased restlessness, agitation, or irritability.  You are unable to recognize people or places.  You have neck pain.  It is difficult to wake you up.  You have unusual behavior changes.  You lose consciousness. MAKE SURE YOU:  Understand these instructions.  Will watch your condition.  Will get help right away if you are not doing well or get worse.   This information is not intended to replace advice given to you by your health care provider. Make sure you discuss any questions you have with your health care provider.   Document Released: 10/31/2003 Document Revised: 08/31/2014 Document Reviewed: 03/02/2013 Elsevier Interactive Patient  Education 2016 [employer]   Amitriptyline tablets What is this medicine? AMITRIPTYLINE (a mee TRIP ti leen) is used to treat depression. This medicine may be used for other purposes; ask your health care provider or pharmacist if you have questions. What should I tell my health care provider before I take this medicine? They need to know if you have any of these conditions: -an alcohol problem -asthma, difficulty breathing -bipolar disorder or schizophrenia -difficulty passing urine, prostate trouble -glaucoma -heart disease or previous heart attack -liver disease -over active thyroid -seizures -thoughts or plans of suicide, a previous suicide attempt, or family history of suicide attempt -an unusual or allergic reaction to amitriptyline, other medicines, foods, dyes, or preservatives -pregnant or trying to get pregnant -breast-feeding How should I use this medicine? Take this medicine by mouth with a drink of water. Follow the directions on the prescription label. You can take the tablets with or without  food. Take your medicine at regular intervals. Do not take it more often than directed. Do not stop taking this medicine suddenly except upon the advice of your doctor. Stopping this medicine too quickly may cause serious side effects or your condition may worsen. A special MedGuide will be given to you by the pharmacist with each prescription and refill. Be sure to read this information carefully each time. Talk to your pediatrician regarding the use of this medicine in children. Special care may be needed. Overdosage: If you think you have taken too much of this medicine contact a poison control center or emergency room at once. NOTE: This medicine is only for you. Do not share this medicine with others. What if I miss a dose? If you miss a dose, take it as soon as you can. If it is almost time for your next dose, take only that dose. Do not take double or extra doses. What may interact with this medicine? Do not take this medicine with any of the following medications: -arsenic trioxide -certain medicines used to regulate abnormal heartbeat or to treat other heart conditions -cisapride -droperidol -halofantrine -linezolid -MAOIs like Carbex, Eldepryl, Marplan, Nardil, and Parnate -methylene blue -other medicines for mental depression -phenothiazines like perphenazine, thioridazine and chlorpromazine -pimozide -probucol -procarbazine -sparfloxacin -St. John's Wort -ziprasidone This medicine may also interact with the following medications: -atropine and related drugs like hyoscyamine, scopolamine, tolterodine and others -barbiturate medicines for inducing sleep or treating seizures, like phenobarbital -cimetidine -disulfiram -ethchlorvynol -thyroid hormones such as levothyroxine This list may not describe all possible interactions. Give your health care provider a list of all the medicines, herbs, non-prescription drugs, or dietary supplements you use. Also tell them if you smoke,  drink alcohol, or use illegal drugs. Some items may interact with your medicine. What should I watch for while using this medicine? Tell your doctor if your symptoms do not get better or if they get worse. Visit your doctor or health care professional for regular checks on your progress. Because it may take several weeks to see the full effects of this medicine, it is important to continue your treatment as prescribed by your doctor. Patients and their families should watch out for new or worsening thoughts of suicide or depression. Also watch out for sudden changes in feelings such as feeling anxious, agitated, panicky, irritable, hostile, aggressive, impulsive, severely restless, overly excited and hyperactive, or not being able to sleep. If this happens, especially at the beginning of treatment or after a change in dose, call your health care professional. You  may get drowsy or dizzy. Do not drive, use machinery, or do anything that needs mental alertness until you know how this medicine affects you. Do not stand or sit up quickly, especially if you are an older patient. This reduces the risk of dizzy or fainting spells. Alcohol may interfere with the effect of this medicine. Avoid alcoholic drinks. Do not treat yourself for coughs, colds, or allergies without asking your doctor or health care professional for advice. Some ingredients can increase possible side effects. Your mouth may get dry. Chewing sugarless gum or sucking hard candy, and drinking plenty of water will help. Contact your doctor if the problem does not go away or is severe. This medicine may cause dry eyes and blurred vision. If you wear contact lenses you may feel some discomfort. Lubricating drops may help. See your eye doctor if the problem does not go away or is severe. This medicine can cause constipation. Try to have a bowel movement at least every 2 to 3 days. If you do not have a bowel movement for 3 days, call your doctor or  health care professional. This medicine can make you more sensitive to the sun. Keep out of the sun. If you cannot avoid being in the sun, wear protective clothing and use sunscreen. Do not use sun lamps or tanning beds/booths. What side effects may I notice from receiving this medicine? Side effects that you should report to your doctor or health care professional as soon as possible: -allergic reactions like skin rash, itching or hives, swelling of the face, lips, or tongue -abnormal production of milk in females -breast enlargement in both males and females -breathing problems -confusion, hallucinations -fast, irregular heartbeat -fever with increased sweating -muscle stiffness, or spasms -pain or difficulty passing urine, loss of bladder control -seizures -suicidal thoughts or other mood changes -swelling of the testicles -tingling, pain, or numbness in the feet or hands -yellowing of the eyes or skin Side effects that usually do not require medical attention (report to your doctor or health care professional if they continue or are bothersome): -change in sex drive or performance -constipation or diarrhea -nausea, vomiting -weight gain or loss This list may not describe all possible side effects. Call your doctor for medical advice about side effects. You may report side effects to FDA at 1-800-FDA-1088. Where should I keep my medicine? Keep out of the reach of children. Store at room temperature between 20 and 25 degrees C (68 and 77 degrees F). Throw away any unused medicine after the expiration date. NOTE: This sheet is a summary. It may not cover all possible information. If you have questions about this medicine, talk to your doctor, pharmacist, or health care provider.    2016, [employer]vier/[employer]. (2011-12-28 13:50:32)

## 2016-02-17 NOTE — Progress Notes (Addendum)
[facility]    Provider:  Dr Lucia Gaskins Referring Provider: Otho Darner, MD Primary Care Physician:  Eula Listen  CC:  Headaches  HPI:  Sharon Kennedy is a 55 y.o. female here as a referral from Dr. Althea Charon for headaches. PMHx anxiety. She went to dinner for her daughter's graduation, she slipped and fell after coming down the ramp and she went face down, she lost consciousness, a lot of nausea. Since then headache, dizziness, apprehension about falling again and anxiety, she has been having nightmares, not sleeping well. She has insomnia.She is fatigued and overwhelmed. She has a nagging headache all day long, all day long, she has some worsened vision on the right side of her eye and so reading makes it worse, being on the computer and phone. She feels off balance. No room spinning but feels like she is going fall. She is irritable. She feels like she can;t concentrate. Not getting any better.She was referred to an opthalmologist and her vision was checked. She also has some right sided arm and leg pain since the fall.   Reviewed notes, labs and imaging from outside physicians, which showed: Patient was referred for ongoing headaches after a fall. Patient had a fall in 02/03/2016. She was stepping up a ramp at a [employer] and his slipped area. Apparently she passed out and came to, her daughter was over her, she was taken to the ER via EMS. She suffered head trauma, neck pain, right shoulder and arm pain, right hip and thigh pain, bilateral knee pain, right foot pain. In the ER she had imaging studies of her right shoulder and CT scan of her head face and neck. She continues to have neck pain, she continues to have headaches. She does report tingling in the right forearm and numbness in the right hand and shoulder region. Her right shoulder and forearm pain are aggravated with ranges of motion. She also continues to have bilateral knee pain with the right being  greater than the left, no swelling locking or giving way. Examination revealed full range of motion of the neck, negative midline tenderness, tenderness palpation toward right paraspinal muscular region. Right shoulder reveals full range of motion. Negative provocative rotator cuff signs. Tenderness towards the upper posterior aspect. Examination of her right elbow and forearm reveal tenderness palpation toward the lower extensor aspect of the forearm. No palpable defects appreciated. Examination of the right wrist reveals tenderness in the snuffbox region. No swelling or instability noted. Examination of the right hip shows full range of motion. Some tenderness toward her greater trochanter bursa region. Examination of the right knee reveals no obvious erythema, warmth or effusion. Shows full range of motion. She does have some tenderness in the lateral joint line as well as the bursa region. Mild swelling in the bursa region. Overall the knee is stable. 3 x-rays of the right wrist are without evidence of any acute bone changes. 4 view x-ray of the right knee and to view of the left knee reveals some mild compromise of the patellofemoral joint space but otherwise she maintains good spacing and alignment. Neck had an maxillofacial CT performed on 02/04/2016 are unremarkable except for left-sided C4-C5 narrowing of the foramen. Three-view x-rays of the right shoulder are unremarkable on 02/04/2016.   She was diagnosed with neck pain associated with cervical strain with pre-existing left-sided C4-C5 foraminal narrowing. Right shoulder pain with strain. Right forearm pain and strain. Right lateral hip pain and strain contusion. Right and  left knee pain with involvement of right pass anserine bursitis. Headache status post loss of consciousness after a fall.  She was placed in physical therapy for her neck and knees. She will continue diclofenac, Robaxin as needed. She was fitted with a right wrist splint. She was  referred to neurology for ongoing headache.   CT of the head 01/2016 showed No acute intracranial abnormalities including mass lesion or mass effect, hydrocephalus, extra-axial fluid collection, midline shift, hemorrhage, or acute infarction, large ischemic events (personally reviewed images)  FINDINGS: CT HEAD FINDINGS  INTRACRANIAL CONTENTS: The ventricles and sulci are normal. No intraparenchymal hemorrhage, mass effect nor midline shift. No acute large vascular territory infarcts. No abnormal extra-axial fluid collections. Basal cisterns are patent.  SKULL/SOFT TISSUES: No skull fracture. No significant soft tissue swelling.  CT MAXILLOFACIAL FINDINGS  FACIAL BONES: The mandible is intact, the condyles are located. No acute facial fracture. No destructive bony lesions.  SINUSES: Small LEFT maxillary sinus mucosal retention cyst without paranasal sinus air-fluid levels. The mastoid air cells are well aerated.  ORBITS: Ocular globes and orbital contents are unremarkable.  SOFT TISSUES: RIGHT facial and periorbital soft tissue swelling without subcutaneous gas radiopaque foreign bodies.  CT CERVICAL SPINE FINDINGS  OSSEOUS STRUCTURES: Cervical vertebral bodies and posterior elements are intact and aligned with maintenance of the cervical lordosis. Mild C3-4, moderate C4-5, and C5-6 disc height loss with uncovertebral hypertrophy. Severe LEFT C4-5 neural foraminal narrowing. No destructive bony lesions. C1-2 articulation maintained. Moderate RIGHT C7-T1 facet arthropathy.  SOFT TISSUES: Included prevertebral and paraspinal soft tissues are unremarkable.  IMPRESSION: CT HEAD: Negative CT HEAD.  CT MAXILLOFACIAL: RIGHT facial and periorbital soft tissue swelling without postseptal hematoma. No acute facial fracture.  CT CERVICAL SPINE: Straightened cervical lordosis without acute fracture or malalignment.  Severe LEFT C4-5 neural foraminal narrowing.   Review of  Systems: Patient complains of symptoms per HPI as well as the following symptoms: Fatigue, blurred vision, anemia, sleepiness. Pertinent negatives per HPI. All others negative.   Social History   Social History  . Marital Status: Married    Spouse Name: Rahsul  . Number of Children: 1  . Years of Education: 5118   Occupational History  . Practice Administrator- [employer]    Social History Main Topics  . Smoking status: Never Smoker   . Smokeless tobacco: Not on file  . Alcohol Use: No  . Drug Use: No  . Sexual Activity: Not on file   Other Topics Concern  . Not on file   Social History Narrative   Lives w/ husband Rahsul   Caffeine use: Drinks coffee (2 cups/day)    Family History  Problem Relation Age of Onset  . Hypertension Mother   . Diabetes Mother   . Migraines Neg Hx     Past Medical History  Diagnosis Date  . Anxiety   . Headache     Past Surgical History  Procedure Laterality Date  . Ablation  2009  . Myomectomy  2009    Current Outpatient Prescriptions  Medication Sig Dispense Refill  . cetirizine-pseudoephedrine (ZYRTEC-D) 5-120 MG tablet Take 1 tablet by mouth 2 (two) times daily.    . methocarbamol (ROBAXIN) 500 MG tablet Take 1 tablet (500 mg total) by mouth 2 (two) times daily. 20 tablet 0  . Multiple Vitamin (MULTIVITAMIN WITH MINERALS) TABS tablet Take 1 tablet by mouth daily.    Marland Kitchen. amitriptyline (ELAVIL) 25 MG tablet Take 1 tablet (25 mg total) by mouth at  bedtime. Start with 1/2 tablet and increase to a whole tablet in 2 weeks 30 tablet 11  . Diclofenac Sodium CR (VOLTAREN-XR) 100 MG 24 hr tablet Take 1 tablet (100 mg total) by mouth daily. (Patient not taking: Reported on 02/17/2016) 10 tablet 0   No current facility-administered medications for this visit.    Allergies as of 02/17/2016 - Review Complete 02/17/2016  Allergen Reaction Noted  . Sulfa antibiotics Shortness Of Breath 07/26/2014    Vitals: BP 113/70 mmHg  Pulse 73  Ht 5'  7" (1.702 m)  Wt 182 lb 12.8 oz (82.918 kg)  BMI 28.62 kg/m2 Last Weight:  Wt Readings from Last 1 Encounters:  02/17/16 182 lb 12.8 oz (82.918 kg)   Last Height:   Ht Readings from Last 1 Encounters:  02/17/16 5\' 7"  (1.702 m)   Physical exam: Exam: Gen: NAD, conversant, well nourised, well groomed                     CV: RRR, no MRG. No Carotid Bruits. No peripheral edema, warm, nontender Eyes: Conjunctivae clear without exudates or hemorrhage  Neuro: Detailed Neurologic Exam  Speech:    Speech is normal; fluent and spontaneous with normal comprehension.  Cognition:    The patient is oriented to person, place, and time;     recent and remote memory intact;     language fluent;     normal attention, concentration,     fund of knowledge Cranial Nerves:    The pupils are equal, round, and reactive to light. Right 20/70, left 20/30, bilat 20/50 uncorrected. Attempted fundoscopic exam could not visualize due to small pupils.  Visual fields are full to finger confrontation. Extraocular movements are intact. Trigeminal sensation is intact and the muscles of mastication are normal. The face is symmetric. The palate elevates in the midline. Hearing intact. Voice is normal. Shoulder shrug is normal. The tongue has normal motion without fasciculations.   Coordination:    No dysmetria noted  Gait:    Normal native gait  Motor Observation:    No asymmetry, no atrophy, and no involuntary movements noted. Tone:    Normal muscle tone.    Posture:    Posture is normal. normal erect    Strength: She has mild weakness of the right arm possible giveway due to pain. Strength is V/V in the upper and lower limbs.      Sensation: intact to LT     Reflex Exam:  DTR's:    Deep tendon reflexes in the upper and lower extremities are normal bilaterally.   Toes:    The toes are downgoing bilaterally.   Clonus:    Clonus is absent.       Assessment/Plan:  55 year old lovely patient  with post-concussive syndrome. Discussed with patient at length. Rest is important in concussion recovery. Recommend shortened work days, working from home if she can, taking frequent breaks, reduced workload. No strenuous activity, limiting computer and reading time.  - Cervical pillow at night fo neck may help - provided concussion documentation - Amitriptyline qhs. Discussed side effects as in the patient instructions.  - Will start Amitriptyline at night. This may help her with her headaches and insomnia as well as help with mood. - Discussed with patientthat rest is important in concussion recovery. Recommend shortened school days, working from home if she can, taking frequent breaks. No strenuous activity, limiting computer and reading time. - Labs today including CBC, CMP  Discussed side effects of Amitriptyline including teratogenicity, do not Pregnant on this medication and use birth control. Serious side effects can include hypotension, hypertension, syncope, ventricular arrhythmias, QT prolongation and other cardiac side effects, stroke and seizures, ataxia tardive dyskinesias, extrapyramidal symptoms, increased intraocular pressure, leukopenia, thrombocytopenia, hallucinations, suicidality and other serious side effects. Common reactions include drowsiness, dry mouth, dizziness, constipation, blurred vision, palpitations, tachycardia, impaired coordination, increased appetite, nausea vomiting, weakness, confusion, disorientation, restlessness, anxiety and other side effects.  Discussed the following:  To prevent or relieve headaches, try the following: Cool Compress. Lie down and place a cool compress on your head.  Avoid headache triggers. If certain foods or odors seem to have triggered your migraines in the past, avoid them. A headache diary might help you identify triggers.  Include physical activity in your daily routine. Try a daily walk or other moderate aerobic exercise.  Manage  stress. Find healthy ways to cope with the stressors, such as delegating tasks on your to-do list.  Practice relaxation techniques. Try deep breathing, yoga, massage and visualization.  Eat regularly. Eating regularly scheduled meals and maintaining a healthy diet might help prevent headaches. Also, drink plenty of fluids.  Follow a regular sleep schedule. Sleep deprivation might contribute to headaches Consider biofeedback. With this mind-body technique, you learn to control certain bodily functions - such as muscle tension, heart rate and blood pressure - to prevent headaches or reduce headache pain.    Proceed to emergency room if you experience new or worsening symptoms or symptoms do not resolve, if you have new neurologic symptoms or if headache is severe, or for any concerning symptom.    Naomie Dean, MD  [facility] [address] [place], [place] [address]  Phone [phone] Fax [phone]

## 2016-02-17 NOTE — Addendum Note (Signed)
Addended by: Naomie DeanAHERN, Ena Demary B on: 02/17/2016 08:15 AM   Modules accepted: Level of Service

## 2016-02-18 LAB — CBC
Hematocrit: 35.3 % (ref 34.0–46.6)
Hemoglobin: 11.1 g/dL (ref 11.1–15.9)
MCH: 25.3 pg — ABNORMAL LOW (ref 26.6–33.0)
MCHC: 31.4 g/dL — ABNORMAL LOW (ref 31.5–35.7)
MCV: 80 fL (ref 79–97)
Platelets: 269 10*3/uL (ref 150–379)
RBC: 4.39 x10E6/uL (ref 3.77–5.28)
RDW: 16.6 % — ABNORMAL HIGH (ref 12.3–15.4)
WBC: 4.1 10*3/uL (ref 3.4–10.8)

## 2016-02-18 LAB — COMPREHENSIVE METABOLIC PANEL
ALT: 13 IU/L (ref 0–32)
AST: 17 IU/L (ref 0–40)
Albumin/Globulin Ratio: 1.7 (ref 1.2–2.2)
Albumin: 4.5 g/dL (ref 3.5–5.5)
Alkaline Phosphatase: 71 IU/L (ref 39–117)
BUN/Creatinine Ratio: 16 (ref 9–23)
BUN: 12 mg/dL (ref 6–24)
Bilirubin Total: 0.2 mg/dL (ref 0.0–1.2)
CO2: 23 mmol/L (ref 18–29)
Calcium: 9.6 mg/dL (ref 8.7–10.2)
Chloride: 103 mmol/L (ref 96–106)
Creatinine, Ser: 0.75 mg/dL (ref 0.57–1.00)
GFR calc Af Amer: 105 mL/min/{1.73_m2} (ref >59)
GFR calc non Af Amer: 91 mL/min/{1.73_m2} (ref >59)
Globulin, Total: 2.7 g/dL (ref 1.5–4.5)
Glucose: 88 mg/dL (ref 65–99)
Potassium: 5.2 mmol/L (ref 3.5–5.2)
Sodium: 142 mmol/L (ref 134–144)
Total Protein: 7.2 g/dL (ref 6.0–8.5)

## 2016-02-19 ENCOUNTER — Telehealth: Payer: Self-pay | Admitting: *Deleted

## 2016-02-19 NOTE — Telephone Encounter (Signed)
LVM for pt to call about results. Gave [facility] phone number.   *Ok to inform pt labs unremarkable per Dr Ahern 

## 2016-02-19 NOTE — Telephone Encounter (Signed)
-----   Message from Anson FretAntonia B Ahern, MD sent at 02/18/2016 10:13 AM EDT ----- Labs unremarkable

## 2016-02-20 NOTE — Telephone Encounter (Signed)
Advised patient per notes below that labs were unremarkable.

## 2016-05-13 ENCOUNTER — Ambulatory Visit (INDEPENDENT_AMBULATORY_CARE_PROVIDER_SITE_OTHER): Payer: 59 | Admitting: Neurology

## 2016-05-13 ENCOUNTER — Encounter: Payer: Self-pay | Admitting: Neurology

## 2016-05-13 VITALS — BP 123/68 | HR 82

## 2016-05-13 DIAGNOSIS — R51 Headache: Secondary | ICD-10-CM

## 2016-05-13 DIAGNOSIS — H5461 Unqualified visual loss, right eye, normal vision left eye: Secondary | ICD-10-CM

## 2016-05-13 DIAGNOSIS — R519 Headache, unspecified: Secondary | ICD-10-CM

## 2016-05-13 NOTE — Progress Notes (Signed)
[facility]    Provider:  Dr Jaynee Eagles Referring Provider: No ref. provider found Primary Care Physician:  No PCP Per Patient  CC:  Headaches  Interval history 05/13/2016: Patient returns with ongoing headache. She feels dizzy and he head hurts terrible for a few minutes. She has these episodes a few times a week. It feels like she "passes out for a second" her head gets heavy and then she has severe head on the right side of the head. The other symptoms. She feels a pounding headache and pain. She gets vision changes with the headache. She has a squeezing on the right side. Lasts 20 minutes. No triggers. It has been ongoing. Mostly on the right side. The headache is 6/10. It has not gotten better worsening. Given worsening nature with vision changes will order MRI brain w/wo contrast and esr,crp.    HPI 02/17/2016:  Sharon Kennedy is a 55 y.o. female here as a referral from Dr. Rip Harbour for headaches. PMHx anxiety. She went to dinner for her daughter's graduation, she slipped and fell after coming down the ramp and she went face down, she lost consciousness, a lot of nausea. Since then headache, dizziness, apprehension about falling again and anxiety, she has been having nightmares, not sleeping well. She has insomnia.She is fatigued and overwhelmed. She has a nagging headache all day long, all day long, she has some worsened vision on the right side of her eye and so reading makes it worse, being on the computer and phone. She feels off balance. No room spinning but feels like she is going fall. She is irritable. She feels like she can;t concentrate. Not getting any better.She was referred to an opthalmologist and her vision was checked. She also has some right sided arm and leg pain since the fall.   Reviewed notes, labs and imaging from outside physicians, which showed: Patient was referred for ongoing headaches after a fall. Patient had a fall in 02/03/2016. She was stepping up a  ramp at a [employer] and his slipped area. Apparently she passed out and came to, her daughter was over her, she was taken to the ER via EMS. She suffered head trauma, neck pain, right shoulder and arm pain, right hip and thigh pain, bilateral knee pain, right foot pain. In the ER she had imaging studies of her right shoulder and CT scan of her head face and neck. She continues to have neck pain, she continues to have headaches. She does report tingling in the right forearm and numbness in the right hand and shoulder region. Her right shoulder and forearm pain are aggravated with ranges of motion. She also continues to have bilateral knee pain with the right being greater than the left, no swelling locking or giving way. Examination revealed full range of motion of the neck, negative midline tenderness, tenderness palpation toward right paraspinal muscular region. Right shoulder reveals full range of motion. Negative provocative rotator cuff signs. Tenderness towards the upper posterior aspect. Examination of her right elbow and forearm reveal tenderness palpation toward the lower extensor aspect of the forearm. No palpable defects appreciated. Examination of the right wrist reveals tenderness in the snuffbox region. No swelling or instability noted. Examination of the right hip shows full range of motion. Some tenderness toward her greater trochanter bursa region. Examination of the right knee reveals no obvious erythema, warmth or effusion. Shows full range of motion. She does have some tenderness in the lateral joint line as well as  the bursa region. Mild swelling in the bursa region. Overall the knee is stable. 3 x-rays of the right wrist are without evidence of any acute bone changes. 4 view x-ray of the right knee and to view of the left knee reveals some mild compromise of the patellofemoral joint space but otherwise she maintains good spacing and alignment. Neck had an maxillofacial CT performed on  02/04/2016 are unremarkable except for left-sided C4-C5 narrowing of the foramen. Three-view x-rays of the right shoulder are unremarkable on 02/04/2016.   She was diagnosed with neck pain associated with cervical strain with pre-existing left-sided C4-C5 foraminal narrowing. Right shoulder pain with strain. Right forearm pain and strain. Right lateral hip pain and strain contusion. Right and left knee pain with involvement of right pass anserine bursitis. Headache status post loss of consciousness after a fall.  She was placed in physical therapy for her neck and knees. She will continue diclofenac, Robaxin as needed. She was fitted with a right wrist splint. She was referred to neurology for ongoing headache.   CT of the head 01/2016 showed No acute intracranial abnormalities including mass lesion or mass effect, hydrocephalus, extra-axial fluid collection, midline shift, hemorrhage, or acute infarction, large ischemic events (personally reviewed images)  FINDINGS: CT HEAD FINDINGS  INTRACRANIAL CONTENTS: The ventricles and sulci are normal. No intraparenchymal hemorrhage, mass effect nor midline shift. No acute large vascular territory infarcts. No abnormal extra-axial fluid collections. Basal cisterns are patent.  SKULL/SOFT TISSUES: No skull fracture. No significant soft tissue swelling.  CT MAXILLOFACIAL FINDINGS  FACIAL BONES: The mandible is intact, the condyles are located. No acute facial fracture. No destructive bony lesions.  SINUSES: Small LEFT maxillary sinus mucosal retention cyst without paranasal sinus air-fluid levels. The mastoid air cells are well aerated.  ORBITS: Ocular globes and orbital contents are unremarkable.  SOFT TISSUES: RIGHT facial and periorbital soft tissue swelling without subcutaneous gas radiopaque foreign bodies.  CT CERVICAL SPINE FINDINGS  OSSEOUS STRUCTURES: Cervical vertebral bodies and posterior elements are intact and aligned with  maintenance of the cervical lordosis. Mild C3-4, moderate C4-5, and C5-6 disc height loss with uncovertebral hypertrophy. Severe LEFT C4-5 neural foraminal narrowing. No destructive bony lesions. C1-2 articulation maintained. Moderate RIGHT C7-T1 facet arthropathy.  SOFT TISSUES: Included prevertebral and paraspinal soft tissues are unremarkable.  IMPRESSION: CT HEAD: Negative CT HEAD.  CT MAXILLOFACIAL: RIGHT facial and periorbital soft tissue swelling without postseptal hematoma. No acute facial fracture.  CT CERVICAL SPINE: Straightened cervical lordosis without acute fracture or malalignment.  Severe LEFT C4-5 neural foraminal narrowing.   Review of Systems: Patient complains of symptoms per HPI as well as the following symptoms: Fatigue, blurred vision, anemia, sleepiness. Pertinent negatives per HPI. All others negative.   Social History   Social History  . Marital status: Married    Spouse name: Rahsul  . Number of children: 1  . Years of education: 66   Occupational History  . Practice Administrator- [employer]    Social History Main Topics  . Smoking status: Never Smoker  . Smokeless tobacco: Not on file  . Alcohol use No  . Drug use: No  . Sexual activity: Not on file   Other Topics Concern  . Not on file   Social History Narrative   Lives w/ husband Rahsul   Caffeine use: Drinks coffee (2 cups/day)    Family History  Problem Relation Age of Onset  . Hypertension Mother   . Diabetes Mother   . Migraines Neg  Hx     Past Medical History:  Diagnosis Date  . Anxiety   . Headache     Past Surgical History:  Procedure Laterality Date  . ABLATION  2009  . MYOMECTOMY  2009    Current Outpatient Prescriptions  Medication Sig Dispense Refill  . amitriptyline (ELAVIL) 25 MG tablet Take 1 tablet (25 mg total) by mouth at bedtime. Start with 1/2 tablet and increase to a whole tablet in 2 weeks 30 tablet 11  . cetirizine-pseudoephedrine (ZYRTEC-D)  5-120 MG tablet Take 1 tablet by mouth 2 (two) times daily.    . Diclofenac Sodium CR (VOLTAREN-XR) 100 MG 24 hr tablet Take 1 tablet (100 mg total) by mouth daily. (Patient not taking: Reported on 02/17/2016) 10 tablet 0  . methocarbamol (ROBAXIN) 500 MG tablet Take 1 tablet (500 mg total) by mouth 2 (two) times daily. 20 tablet 0  . Multiple Vitamin (MULTIVITAMIN WITH MINERALS) TABS tablet Take 1 tablet by mouth daily.     No current facility-administered medications for this visit.     Allergies as of 05/13/2016 - Review Complete 02/17/2016  Allergen Reaction Noted  . Sulfa antibiotics Shortness Of Breath 07/26/2014    Vitals: There were no vitals taken for this visit. Last Weight:  Wt Readings from Last 1 Encounters:  02/17/16 182 lb 12.8 oz (82.9 kg)   Last Height:   Ht Readings from Last 1 Encounters:  02/17/16 '5\' 7"'  (1.702 m)     Neuro: Detailed Neurologic Exam  Speech:    Speech is normal; fluent and spontaneous with normal comprehension.  Cognition:    The patient is oriented to person, place, and time;     recent and remote memory intact;     language fluent;     normal attention, concentration,     fund of knowledge Cranial Nerves:    The pupils are equal, round, and reactive to light. Right 20/70, left 20/30, bilat 20/50 uncorrected. Attempted fundoscopic exam could not visualize due to small pupils.  Visual fields are full to finger confrontation. Extraocular movements are intact. Trigeminal sensation is intact and the muscles of mastication are normal. The face is symmetric. The palate elevates in the midline. Hearing intact. Voice is normal. Shoulder shrug is normal. The tongue has normal motion without fasciculations.   Coordination:    No dysmetria noted  Gait:    Normal native gait  Motor Observation:    No asymmetry, no atrophy, and no involuntary movements noted. Tone:    Normal muscle tone.    Posture:    Posture is normal. normal erect      Strength: She has mild weakness of the right arm possible giveway due to pain. Strength is V/V in the upper and lower limbs.      Sensation: intact to LT     Reflex Exam:  DTR's:    Deep tendon reflexes in the upper and lower extremities are normal bilaterally.   Toes:    The toes are downgoing bilaterally.   Clonus:    Clonus is absent.       Assessment/Plan:  55 year old lovely patient with post-concussive syndrome. Discussed with patient at length. Rest is important in concussion recovery. Recommend shortened work days, working from home if she can, taking frequent breaks, reduced workload. No strenuous activity, limiting computer and reading time.  - Cervical pillow at night fo neck may help - provided concussion documentation - Amitriptyline qhs. Discussed side effects as in the patient  instructions.  - Will start Amitriptyline at night. This may help her with her headaches and insomnia as well as help with mood. - Discussed with patientthat rest is important in concussion recovery. Recommend shortened school days, working from home if she can, taking frequent breaks. No strenuous activity, limiting computer and reading time. - Labs today including CBC, CMP  Discussed side effects of Amitriptyline including teratogenicity, do not Pregnant on this medication and use birth control. Serious side effects can include hypotension, hypertension, syncope, ventricular arrhythmias, QT prolongation and other cardiac side effects, stroke and seizures, ataxia tardive dyskinesias, extrapyramidal symptoms, increased intraocular pressure, leukopenia, thrombocytopenia, hallucinations, suicidality and other serious side effects. Common reactions include drowsiness, dry mouth, dizziness, constipation, blurred vision, palpitations, tachycardia, impaired coordination, increased appetite, nausea vomiting, weakness, confusion, disorientation, restlessness, anxiety and other side  effects.  Discussed the following:  To prevent or relieve headaches, try the following:  Cool Compress. Lie down and place a cool compress on your head.   Avoid headache triggers. If certain foods or odors seem to have triggered your migraines in the past, avoid them. A headache diary might help you identify triggers.   Include physical activity in your daily routine. Try a daily walk or other moderate aerobic exercise.   Manage stress. Find healthy ways to cope with the stressors, such as delegating tasks on your to-do list.   Practice relaxation techniques. Try deep breathing, yoga, massage and visualization.   Eat regularly. Eating regularly scheduled meals and maintaining a healthy diet might help prevent headaches. Also, drink plenty of fluids.   Follow a regular sleep schedule. Sleep deprivation might contribute to headaches  Consider biofeedback. With this mind-body technique, you learn to control certain bodily functions - such as muscle tension, heart rate and blood pressure - to prevent headaches or reduce headache pain.    Proceed to emergency room if you experience new or worsening symptoms or symptoms do not resolve, if you have new neurologic symptoms or if headache is severe, or for any concerning symptom.   Sarina Ill, MD  [facility] [address] [place], [place] [address]  Phone [phone] Fax [phone]  A total of 25 minutes was spent face-to-face with this patient. Over half this time was spent on counseling patient on the headche diagnosis and different diagnostic and therapeutic options available.

## 2016-05-13 NOTE — Patient Instructions (Signed)
Remember to drink plenty of fluid, eat healthy meals and do not skip any meals. Try to eat protein with a every meal and eat a healthy snack such as fruit or nuts in between meals. Try to keep a regular sleep-wake schedule and try to exercise daily, particularly in the form of walking, 20-30 minutes a day, if you can.   As far as diagnostic testing: MRI brain and labs  My clinical assistant and will answer any of your questions and relay your messages to me and also relay most of my messages to you.   Our phone number is [phone]. We also have an after hours call service for urgent matters and there is a physician on-call for urgent questions. For any emergencies you know to call 911 or go to the nearest emergency room

## 2016-05-14 LAB — CREATININE, SERUM
Creatinine, Ser: 0.73 mg/dL (ref 0.57–1.00)
GFR calc Af Amer: 108 mL/min/{1.73_m2} (ref >59)
GFR calc non Af Amer: 94 mL/min/{1.73_m2} (ref >59)

## 2016-05-14 LAB — C-REACTIVE PROTEIN: CRP: 2.2 mg/L (ref 0.0–4.9)

## 2016-05-14 LAB — SEDIMENTATION RATE: Sed Rate: 17 mm/hr (ref 0–40)

## 2016-05-18 ENCOUNTER — Telehealth: Payer: Self-pay | Admitting: *Deleted

## 2016-05-18 NOTE — Telephone Encounter (Signed)
LVM for pt about normal labs per Dr Ahern. Gave [facility] phone number if she has further questions/concerns.  

## 2016-05-18 NOTE — Telephone Encounter (Signed)
-----   Message from Anson FretAntonia B Ahern, MD sent at 05/14/2016  6:08 PM EDT ----- Labs normal thanks

## 2016-06-04 ENCOUNTER — Telehealth: Payer: Self-pay | Admitting: Neurology

## 2016-06-04 NOTE — Telephone Encounter (Signed)
Patient returned Danielle's call °

## 2016-06-10 ENCOUNTER — Ambulatory Visit: Payer: 59 | Admitting: Neurology

## 2016-06-10 ENCOUNTER — Ambulatory Visit (INDEPENDENT_AMBULATORY_CARE_PROVIDER_SITE_OTHER): Payer: 59

## 2016-06-10 DIAGNOSIS — R51 Headache: Secondary | ICD-10-CM | POA: Diagnosis not present

## 2016-06-10 DIAGNOSIS — R519 Headache, unspecified: Secondary | ICD-10-CM

## 2016-06-10 DIAGNOSIS — H5461 Unqualified visual loss, right eye, normal vision left eye: Secondary | ICD-10-CM

## 2016-06-11 MED ORDER — GADOPENTETATE DIMEGLUMINE 469.01 MG/ML IV SOLN: 15.0000 mL | Freq: Once | INTRAVENOUS | Status: AC | PRN

## 2016-06-15 ENCOUNTER — Telehealth: Payer: Self-pay | Admitting: *Deleted

## 2016-06-15 NOTE — Telephone Encounter (Signed)
-----   Message from Anson FretAntonia B Ahern, MD sent at 06/15/2016 11:24 AM EDT ----- MRI brain is normal

## 2016-06-15 NOTE — Telephone Encounter (Signed)
Called and spoke to pt about normal MRI brain. Pt verbalized understanding.  

## 2016-06-16 NOTE — Telephone Encounter (Signed)
Patient has already had MRI.

## 2017-04-06 IMAGING — CT CT HEAD W/O CM
5 of 11 series · 16 of 47 positions shown, 18 images · non-contrast
Comparison: None.

CLINICAL DATA: Slip and fall on greasy floor at restaurant, landing
face first. Facial and RIGHT arm pain.

EXAM:
CT HEAD WITHOUT CONTRAST
CT MAXILLOFACIAL WITHOUT CONTRAST
CT CERVICAL SPINE WITHOUT CONTRAST
TECHNIQUE: Multidetector CT imaging of the head, cervical spine, and
maxillofacial structures were performed using the standard protocol
without intravenous contrast. Multiplanar CT image reconstructions
of the cervical spine and maxillofacial structures were also
generated.

[Series 3: facial st · axial · 0.30mm/px · z∈[-282,-194]mm · 4 of 74 slices shown]
[im 15/74  brain]
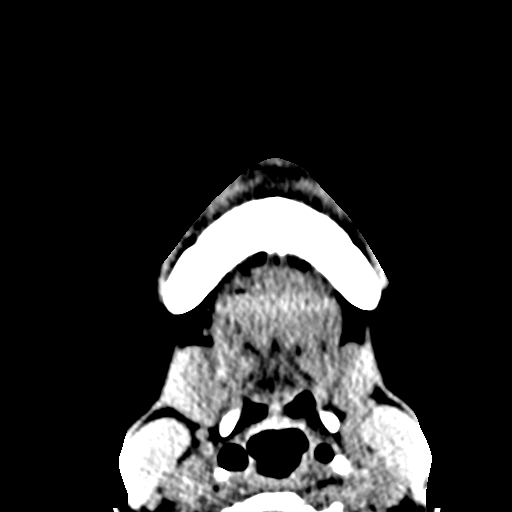
[im 30/74  brain]
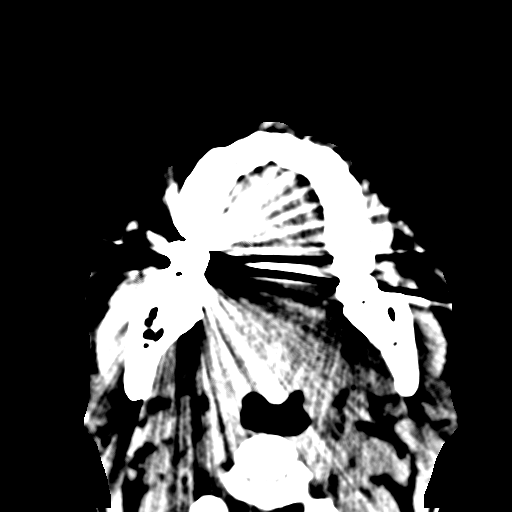
[im 44/74  brain]
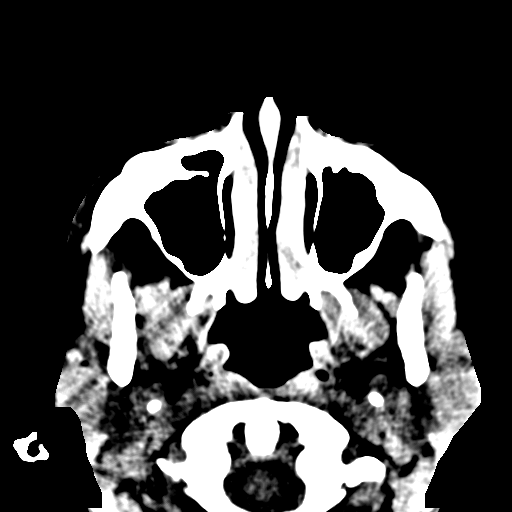
[im 59/74  brain]
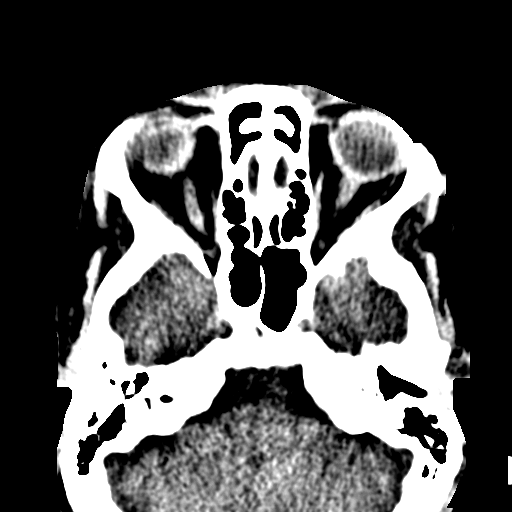

[Series 5: coronal st · coronal · 0.27mm/px · 2 of 76 slices shown]
[im 26/76  brain]
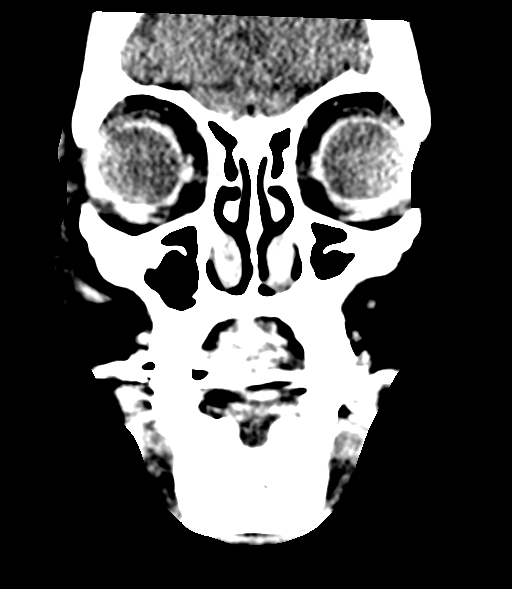
[im 51/76  brain]
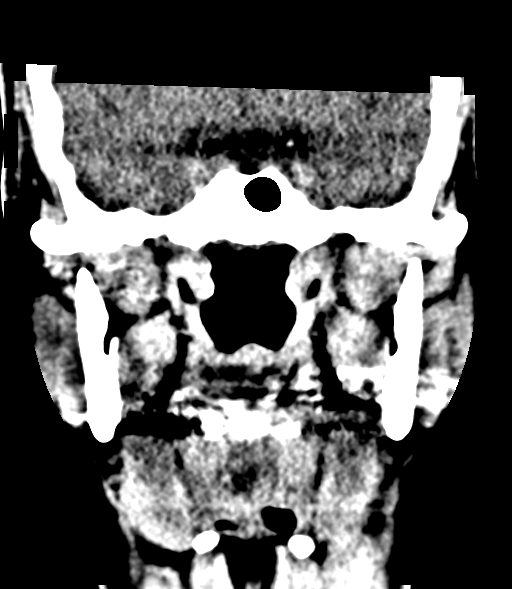

[Series 6: sagittal st · sagittal · 0.30mm/px · 1 of 68 slices shown]
[im 34/68  brain]
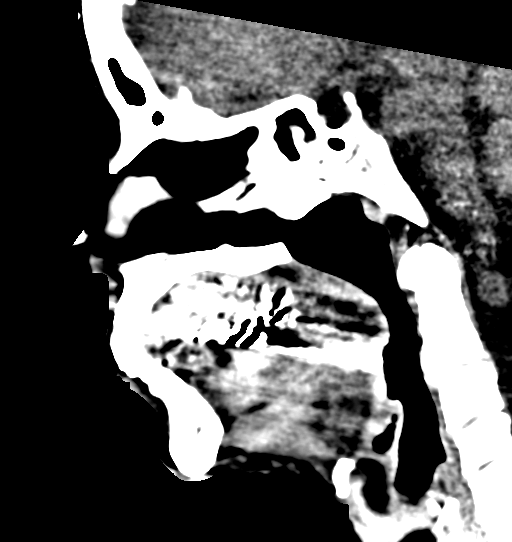

[Series 10: bone windows · axial · 0.44mm/px · z∈[-176,-101]mm · 3 of 51 slices shown]
[im 13/51  bone]
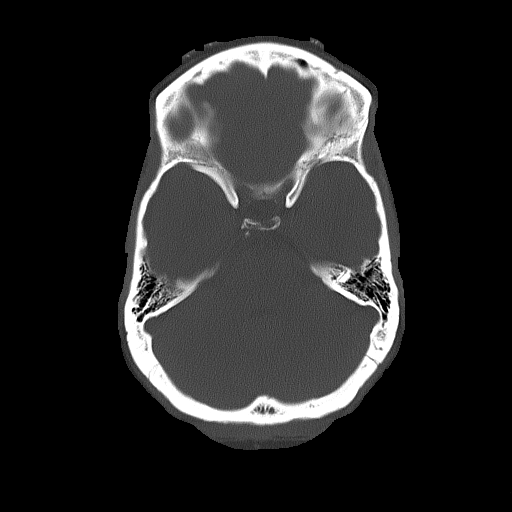
[im 26/51  bone]
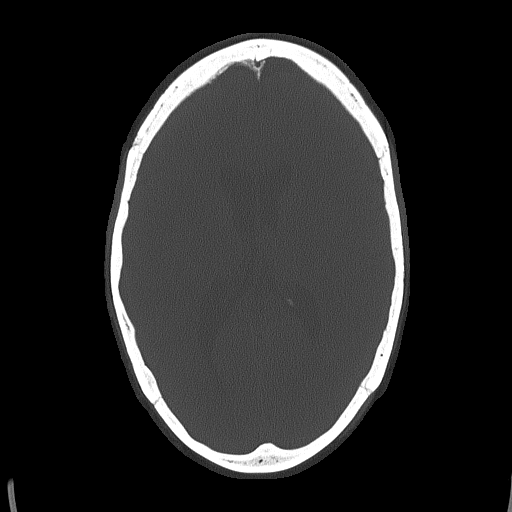
[im 38/51  bone]
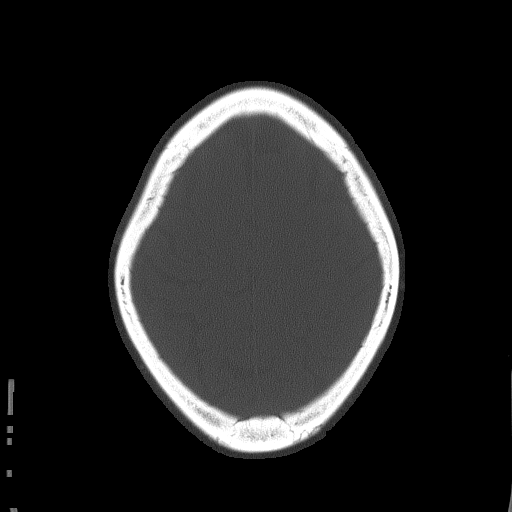

[Series 17: axial · axial · 0.17mm/px · z∈[-363,-248]mm · 6 of 91 slices shown, 8 images]
[im 13/91  brain]
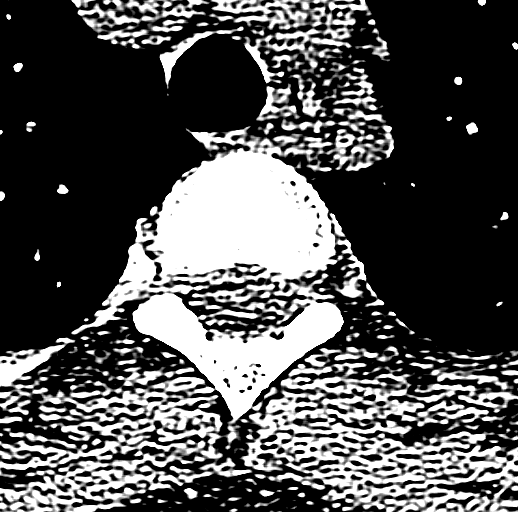
[im 13/91  bone]
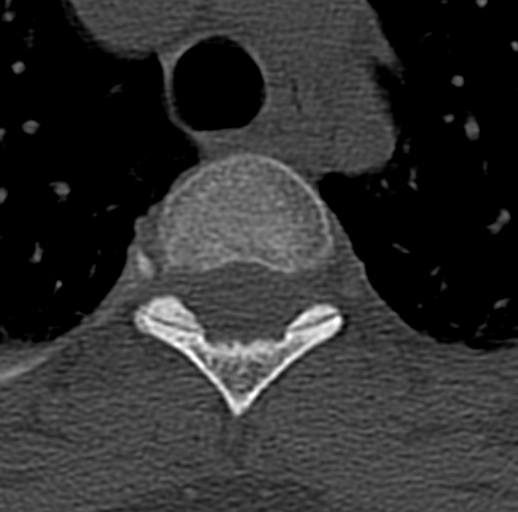
[im 26/91  brain]
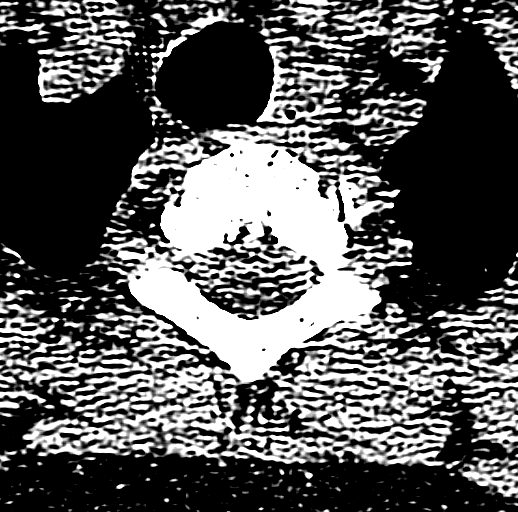
[im 39/91  brain]
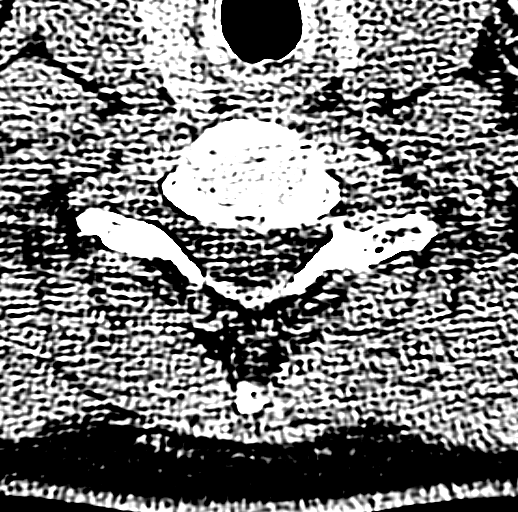
[im 52/91  brain]
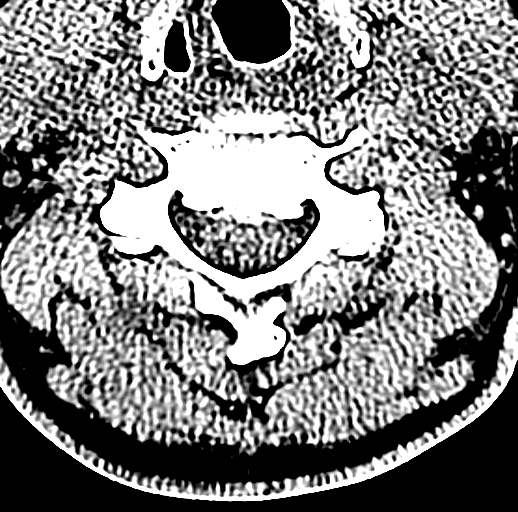
[im 65/91  brain]
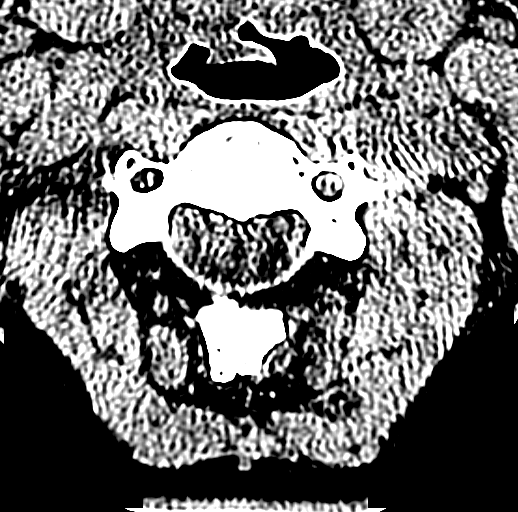
[im 65/91  bone]
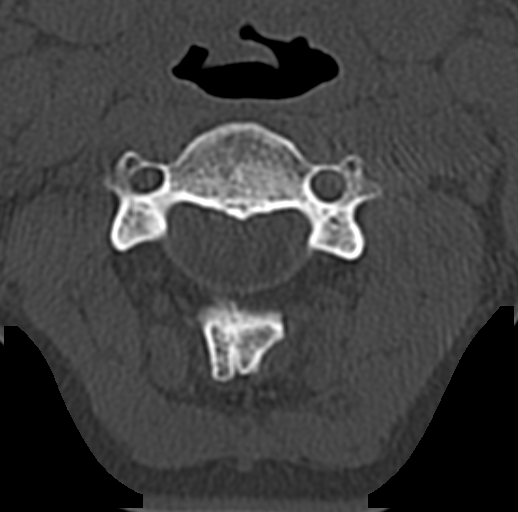
[im 78/91  brain]
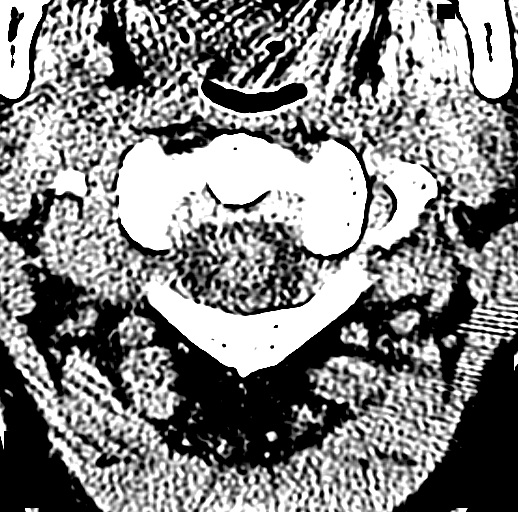

[16 of 47 positions shown; findings below may reference images not displayed]

FINDINGS: CT HEAD FINDINGS

INTRACRANIAL CONTENTS: The ventricles and sulci are normal. No
intraparenchymal hemorrhage, mass effect nor midline shift. No acute
large vascular territory infarcts. No abnormal extra-axial fluid
collections. Basal cisterns are patent.

SKULL/SOFT TISSUES: No skull fracture. No significant soft tissue
swelling.

CT MAXILLOFACIAL FINDINGS

FACIAL BONES: The mandible is intact, the condyles are located. No
acute facial fracture. No destructive bony lesions.

SINUSES: Small LEFT maxillary sinus mucosal retention cyst without
paranasal sinus air-fluid levels. The mastoid air cells are well
aerated.

ORBITS: Ocular globes and orbital contents are unremarkable.

SOFT TISSUES: RIGHT facial and periorbital soft tissue swelling
without subcutaneous gas radiopaque foreign bodies.

CT CERVICAL SPINE FINDINGS

OSSEOUS STRUCTURES: Cervical vertebral bodies and posterior elements
are intact and aligned with maintenance of the cervical lordosis.
Mild C3-4, moderate C4-5, and C5-6 disc height loss with
uncovertebral hypertrophy. Severe LEFT C4-5 neural foraminal
narrowing. No destructive bony lesions. C1-2 articulation
maintained. Moderate RIGHT C7-T1 facet arthropathy.

SOFT TISSUES: Included prevertebral and paraspinal soft tissues are
unremarkable.
IMPRESSION: CT HEAD: Negative CT HEAD.

CT MAXILLOFACIAL: RIGHT facial and periorbital soft tissue swelling
without postseptal hematoma. No acute facial fracture.

CT CERVICAL SPINE: Straightened cervical lordosis without acute
fracture or malalignment.

Severe LEFT C4-5 neural foraminal narrowing.

## 2017-09-15 DIAGNOSIS — M25562 Pain in left knee: Secondary | ICD-10-CM | POA: Diagnosis not present

## 2018-02-28 DIAGNOSIS — M25562 Pain in left knee: Secondary | ICD-10-CM | POA: Diagnosis not present

## 2018-03-31 DIAGNOSIS — H524 Presbyopia: Secondary | ICD-10-CM | POA: Diagnosis not present

## 2018-04-08 DIAGNOSIS — H2513 Age-related nuclear cataract, bilateral: Secondary | ICD-10-CM | POA: Diagnosis not present

## 2018-04-08 DIAGNOSIS — H2512 Age-related nuclear cataract, left eye: Secondary | ICD-10-CM | POA: Diagnosis not present

## 2018-05-11 DIAGNOSIS — Z1389 Encounter for screening for other disorder: Secondary | ICD-10-CM | POA: Diagnosis not present

## 2018-05-11 DIAGNOSIS — Z6828 Body mass index (BMI) 28.0-28.9, adult: Secondary | ICD-10-CM | POA: Diagnosis not present

## 2018-05-11 DIAGNOSIS — Z01419 Encounter for gynecological examination (general) (routine) without abnormal findings: Secondary | ICD-10-CM | POA: Diagnosis not present

## 2018-05-11 DIAGNOSIS — Z1231 Encounter for screening mammogram for malignant neoplasm of breast: Secondary | ICD-10-CM | POA: Diagnosis not present

## 2018-05-26 DIAGNOSIS — H25032 Anterior subcapsular polar age-related cataract, left eye: Secondary | ICD-10-CM | POA: Diagnosis not present

## 2018-05-26 DIAGNOSIS — H2512 Age-related nuclear cataract, left eye: Secondary | ICD-10-CM | POA: Diagnosis not present

## 2018-05-27 DIAGNOSIS — H2511 Age-related nuclear cataract, right eye: Secondary | ICD-10-CM | POA: Diagnosis not present

## 2018-07-14 DIAGNOSIS — H2511 Age-related nuclear cataract, right eye: Secondary | ICD-10-CM | POA: Diagnosis not present

## 2018-10-12 DIAGNOSIS — R109 Unspecified abdominal pain: Secondary | ICD-10-CM | POA: Diagnosis not present

## 2018-10-12 DIAGNOSIS — K589 Irritable bowel syndrome without diarrhea: Secondary | ICD-10-CM | POA: Diagnosis not present

## 2018-10-12 DIAGNOSIS — K219 Gastro-esophageal reflux disease without esophagitis: Secondary | ICD-10-CM | POA: Diagnosis not present

## 2018-10-12 DIAGNOSIS — E739 Lactose intolerance, unspecified: Secondary | ICD-10-CM | POA: Diagnosis not present

## 2018-10-31 DIAGNOSIS — D72818 Other decreased white blood cell count: Secondary | ICD-10-CM | POA: Diagnosis not present

## 2018-11-02 DIAGNOSIS — K589 Irritable bowel syndrome without diarrhea: Secondary | ICD-10-CM | POA: Diagnosis not present

## 2018-11-02 DIAGNOSIS — K219 Gastro-esophageal reflux disease without esophagitis: Secondary | ICD-10-CM | POA: Diagnosis not present

## 2018-11-02 DIAGNOSIS — D649 Anemia, unspecified: Secondary | ICD-10-CM | POA: Diagnosis not present

## 2019-10-29 ENCOUNTER — Ambulatory Visit: Payer: 59 | Attending: Internal Medicine

## 2019-10-29 DIAGNOSIS — Z23 Encounter for immunization: Secondary | ICD-10-CM

## 2019-10-29 NOTE — Progress Notes (Signed)
   Covid-19 Vaccination Clinic  Name:  Sharon Kennedy    MRN: [id number] DOB: June 10, 1961  10/29/2019  Sharon Kennedy was observed post Covid-19 immunization for 30 minutes based on pre-vaccination screening without incident. She was provided with Vaccine Information Sheet and instruction to access the V-Safe system.   Sharon Kennedy was instructed to call 911 with any severe reactions post vaccine: Marland Kitchen Difficulty breathing  . Swelling of face and throat  . A fast heartbeat  . A bad rash all over body  . Dizziness and weakness   Immunizations Administered    Name Date Dose VIS Date Route   Pfizer COVID-19 Vaccine 10/29/2019 10:17 AM 0.3 mL 08/04/2019 Intramuscular   Manufacturer: [employer], [employer]   Lot: [id number]   NDC: [address]-3

## 2019-11-28 ENCOUNTER — Ambulatory Visit: Payer: 59 | Attending: Internal Medicine

## 2019-11-28 DIAGNOSIS — Z23 Encounter for immunization: Secondary | ICD-10-CM

## 2019-11-28 NOTE — Progress Notes (Signed)
   Covid-19 Vaccination Clinic  Name:  Sharon Kennedy    MRN: [id number] DOB: [birth year]  11/28/2019  [name] was observed post Covid-19 immunization for 15 minutes without incident. She was provided with Vaccine Information Sheet and instruction to access the V-Safe system.   [name] was instructed to call 911 with any severe reactions post vaccine: Marland Kitchen Difficulty breathing  . Swelling of face and throat  . A fast heartbeat  . A bad rash all over body  . Dizziness and weakness   Immunizations Administered    Name Date Dose VIS Date Route   Pfizer COVID-19 Vaccine 11/28/2019  4:04 PM 0.3 mL 08/04/2019 Intramuscular   Manufacturer: [employer], [employer]   Lot: [id number]   NDC: [address]-0

## 2022-11-13 ENCOUNTER — Other Ambulatory Visit: Payer: Self-pay | Admitting: Gastroenterology

## 2022-11-13 DIAGNOSIS — R1310 Dysphagia, unspecified: Secondary | ICD-10-CM

## 2022-12-18 ENCOUNTER — Other Ambulatory Visit: Payer: 59

## 2022-12-25 ENCOUNTER — Inpatient Hospital Stay: Admission: RE | Admit: 2022-12-25 | Payer: 59 | Source: Ambulatory Visit
